# Patient Record
Sex: Female | Born: 1980
Health system: Southern US, Community
[De-identification: ages and names within clinical notes are randomized; demographics above are authoritative.]

## PROBLEM LIST (undated history)

## (undated) DIAGNOSIS — D171 Benign lipomatous neoplasm of skin and subcutaneous tissue of trunk: Secondary | ICD-10-CM

## (undated) DIAGNOSIS — K921 Melena: Secondary | ICD-10-CM

## (undated) DIAGNOSIS — U071 COVID-19: Secondary | ICD-10-CM

## (undated) DIAGNOSIS — H8120 Vestibular neuronitis, unspecified ear: Secondary | ICD-10-CM

## (undated) HISTORY — DX: Vestibular neuronitis, unspecified ear: H81.20

## (undated) HISTORY — DX: COVID-19: U07.1

## (undated) HISTORY — DX: Benign lipomatous neoplasm of skin and subcutaneous tissue of trunk: D17.1

## (undated) HISTORY — PX: COSMETIC SURGERY: SHX468

## (undated) HISTORY — PX: TONSILLECTOMY: SUR1361

## (undated) HISTORY — DX: Melena: K92.1

---
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
## 2004-01-17 ENCOUNTER — Other Ambulatory Visit: Admission: RE | Admit: 2004-01-17 | Discharge: 2004-01-17 | Payer: Self-pay | Admitting: Obstetrics and Gynecology

## 2004-05-22 ENCOUNTER — Emergency Department (HOSPITAL_COMMUNITY): Admission: EM | Admit: 2004-05-22 | Discharge: 2004-05-22 | Payer: Self-pay | Admitting: Emergency Medicine

## 2004-07-02 ENCOUNTER — Other Ambulatory Visit: Admission: RE | Admit: 2004-07-02 | Discharge: 2004-07-02 | Payer: Self-pay | Admitting: Obstetrics and Gynecology

## 2004-11-11 ENCOUNTER — Other Ambulatory Visit: Admission: RE | Admit: 2004-11-11 | Discharge: 2004-11-11 | Payer: Self-pay | Admitting: Obstetrics and Gynecology

## 2005-04-28 ENCOUNTER — Other Ambulatory Visit: Admission: RE | Admit: 2005-04-28 | Discharge: 2005-04-28 | Payer: Self-pay | Admitting: Obstetrics and Gynecology

## 2006-05-12 ENCOUNTER — Other Ambulatory Visit: Admission: RE | Admit: 2006-05-12 | Discharge: 2006-05-12 | Payer: Self-pay | Admitting: Obstetrics and Gynecology

## 2007-05-16 ENCOUNTER — Other Ambulatory Visit: Admission: RE | Admit: 2007-05-16 | Discharge: 2007-05-16 | Payer: Self-pay | Admitting: Obstetrics and Gynecology

## 2008-05-27 ENCOUNTER — Other Ambulatory Visit: Admission: RE | Admit: 2008-05-27 | Discharge: 2008-05-27 | Payer: Self-pay | Admitting: Obstetrics and Gynecology

## 2008-10-10 ENCOUNTER — Inpatient Hospital Stay (HOSPITAL_COMMUNITY): Admission: AD | Admit: 2008-10-10 | Discharge: 2008-10-10 | Payer: Self-pay | Admitting: Obstetrics and Gynecology

## 2009-01-18 ENCOUNTER — Inpatient Hospital Stay (HOSPITAL_COMMUNITY): Admission: AD | Admit: 2009-01-18 | Discharge: 2009-01-18 | Payer: Self-pay | Admitting: Obstetrics and Gynecology

## 2009-05-01 ENCOUNTER — Ambulatory Visit (HOSPITAL_COMMUNITY): Admission: RE | Admit: 2009-05-01 | Discharge: 2009-05-01 | Payer: Self-pay | Admitting: Obstetrics and Gynecology

## 2009-05-03 ENCOUNTER — Inpatient Hospital Stay (HOSPITAL_COMMUNITY): Admission: AD | Admit: 2009-05-03 | Discharge: 2009-05-06 | Payer: Self-pay | Admitting: Obstetrics and Gynecology

## 2010-06-09 ENCOUNTER — Other Ambulatory Visit: Admission: RE | Admit: 2010-06-09 | Discharge: 2010-06-09 | Payer: Self-pay | Admitting: Obstetrics and Gynecology

## 2010-10-22 LAB — RPR: RPR Ser Ql: NONREACTIVE

## 2010-10-22 LAB — CBC
HCT: 30.1 % — ABNORMAL LOW (ref 36.0–46.0)
HCT: 36.1 % (ref 36.0–46.0)
Hemoglobin: 10.1 g/dL — ABNORMAL LOW (ref 12.0–15.0)
Hemoglobin: 12.3 g/dL (ref 12.0–15.0)
MCHC: 33.6 g/dL (ref 30.0–36.0)
MCHC: 34.2 g/dL (ref 30.0–36.0)
MCV: 85.9 fL (ref 78.0–100.0)
MCV: 86.7 fL (ref 78.0–100.0)
Platelets: 130 10*3/uL — ABNORMAL LOW (ref 150–400)
Platelets: 182 10*3/uL (ref 150–400)
RBC: 3.47 MIL/uL — ABNORMAL LOW (ref 3.87–5.11)
RBC: 4.2 MIL/uL (ref 3.87–5.11)
RDW: 14.8 % (ref 11.5–15.5)
RDW: 14.9 % (ref 11.5–15.5)
WBC: 15.2 10*3/uL — ABNORMAL HIGH (ref 4.0–10.5)
WBC: 9.1 10*3/uL (ref 4.0–10.5)

## 2010-10-29 LAB — URINALYSIS, ROUTINE W REFLEX MICROSCOPIC
Bilirubin Urine: NEGATIVE
Glucose, UA: NEGATIVE mg/dL
Hgb urine dipstick: NEGATIVE
Ketones, ur: NEGATIVE mg/dL
Nitrite: NEGATIVE
Protein, ur: NEGATIVE mg/dL
Specific Gravity, Urine: 1.015 (ref 1.005–1.030)
Urobilinogen, UA: 0.2 mg/dL (ref 0.0–1.0)
pH: 6.5 (ref 5.0–8.0)

## 2010-10-29 LAB — GC/CHLAMYDIA PROBE AMP, GENITAL
Chlamydia, DNA Probe: NEGATIVE
GC Probe Amp, Genital: NEGATIVE

## 2010-10-29 LAB — WET PREP, GENITAL
Trich, Wet Prep: NONE SEEN
Yeast Wet Prep HPF POC: NONE SEEN

## 2011-05-31 ENCOUNTER — Other Ambulatory Visit (HOSPITAL_COMMUNITY)
Admission: RE | Admit: 2011-05-31 | Discharge: 2011-05-31 | Disposition: A | Discharge status: Home / Self Care | Payer: 59 | Source: Ambulatory Visit | Attending: Obstetrics and Gynecology | Admitting: Obstetrics and Gynecology

## 2011-05-31 ENCOUNTER — Other Ambulatory Visit: Payer: Self-pay | Admitting: Obstetrics and Gynecology

## 2011-05-31 DIAGNOSIS — Z113 Encounter for screening for infections with a predominantly sexual mode of transmission: Secondary | ICD-10-CM | POA: Insufficient documentation

## 2011-05-31 DIAGNOSIS — Z01419 Encounter for gynecological examination (general) (routine) without abnormal findings: Secondary | ICD-10-CM | POA: Insufficient documentation

## 2012-03-28 ENCOUNTER — Ambulatory Visit (INDEPENDENT_AMBULATORY_CARE_PROVIDER_SITE_OTHER): Payer: 59 | Admitting: Family Medicine

## 2012-03-28 VITALS — BP 128/74 | HR 68 | Temp 97.8°F | Resp 18 | Ht 59.0 in | Wt 122.0 lb

## 2012-03-28 DIAGNOSIS — H6980 Other specified disorders of Eustachian tube, unspecified ear: Secondary | ICD-10-CM

## 2012-03-28 DIAGNOSIS — H6990 Unspecified Eustachian tube disorder, unspecified ear: Secondary | ICD-10-CM

## 2012-03-28 DIAGNOSIS — R42 Dizziness and giddiness: Secondary | ICD-10-CM

## 2012-03-28 LAB — GLUCOSE, POCT (MANUAL RESULT ENTRY): POC Glucose: 96 mg/dl (ref 70–99)

## 2012-03-28 LAB — POCT CBC
Granulocyte percent: 68 %G (ref 37–80)
HCT, POC: 44.6 % (ref 37.7–47.9)
Hemoglobin: 14.2 g/dL (ref 12.2–16.2)
Lymph, poc: 2.3 (ref 0.6–3.4)
MCH, POC: 27.6 pg (ref 27–31.2)
MCHC: 31.8 g/dL (ref 31.8–35.4)
MCV: 86.6 fL (ref 80–97)
MID (cbc): 0.4 (ref 0–0.9)
MPV: 8.6 fL (ref 0–99.8)
POC Granulocyte: 5.8 (ref 2–6.9)
POC LYMPH PERCENT: 27.3 %L (ref 10–50)
POC MID %: 4.7 %M (ref 0–12)
Platelet Count, POC: 341 10*3/uL (ref 142–424)
RBC: 5.15 M/uL (ref 4.04–5.48)
RDW, POC: 14.1 %
WBC: 8.5 10*3/uL (ref 4.6–10.2)

## 2012-03-28 MED ORDER — MONTELUKAST SODIUM 10 MG PO TABS: 10.0000 mg | ORAL_TABLET | Freq: Every day | ORAL | Status: DC

## 2012-03-28 NOTE — Patient Instructions (Signed)
Results for orders placed in visit on 03/28/12  POCT CBC      Component Value Range   WBC 8.5  4.6 - 10.2 K/uL   Lymph, poc 2.3  0.6 - 3.4   POC LYMPH PERCENT 27.3  10 - 50 %L   MID (cbc) 0.4  0 - 0.9   POC MID % 4.7  0 - 12 %M   POC Granulocyte 5.8  2 - 6.9   Granulocyte percent 68.0  37 - 80 %G   RBC 5.15  4.04 - 5.48 M/uL   Hemoglobin 14.2  12.2 - 16.2 g/dL   HCT, POC 40.9  81.1 - 47.9 %   MCV 86.6  80 - 97 fL   MCH, POC 27.6  27 - 31.2 pg   MCHC 31.8  31.8 - 35.4 g/dL   RDW, POC 91.4     Platelet Count, POC 341  142 - 424 K/uL   MPV 8.6  0 - 99.8 fL  GLUCOSE, POCT (MANUAL RESULT ENTRY)      Component Value Range   POC Glucose 96  70 - 99 mg/dl

## 2012-03-28 NOTE — Progress Notes (Signed)
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 She's had no falls or syncope, she does not have heavy periods because of the Mirena, and she does not have a history of dizziness.. She denies ear pain or tinnitus.  Objective: No acute distress, cheerful Blood pressure sitting is 100/60 and standing is 90/60 HEENT unremarkable with exception of scars on her tympanic membranes and marked serous fluid with retraction of the TMs. Chest: Clear Neck: Supple  Results for orders placed in visit on 03/28/12  POCT CBC      Component Value Range   WBC 8.5  4.6 - 10.2 K/uL   Lymph, poc 2.3  0.6 - 3.4   POC LYMPH PERCENT 27.3  10 - 50 %L   MID (cbc) 0.4  0 - 0.9   POC MID % 4.7  0 - 12 %M   POC Granulocyte 5.8  2 - 6.9   Granulocyte percent 68.0  37 - 80 %G   RBC 5.15  4.04 - 5.48 M/uL   Hemoglobin 14.2  12.2 - 16.2 g/dL   HCT, POC 40.9  81.1 - 47.9 %   MCV 86.6  80 - 97 fL   MCH, POC 27.6  27 - 31.2 pg   MCHC 31.8  31.8 - 35.4 g/dL   RDW, POC 91.4     Platelet Count, POC 341  142 - 424 K/uL   MPV 8.6  0 - 99.8 fL  GLUCOSE, POCT (MANUAL RESULT ENTRY)      Component Value Range   POC Glucose 96  70 - 99 mg/dl   1. Dizziness  POCT CBC, POCT glucose (manual entry), montelukast (SINGULAIR) 10 MG tablet  2. ETD (eustachian tube dysfunction)  montelukast (SINGULAIR) 10 MG tablet   If not improving in 48-72 hours, will need to further investigate.  The cloudy fluid bilaterally behind TM's does suggest ETD as etiology at this point given other normal findings.

## 2012-03-29 ENCOUNTER — Emergency Department (HOSPITAL_COMMUNITY)
Admission: EM | Admit: 2012-03-29 | Discharge: 2012-03-29 | Disposition: A | Discharge status: Home / Self Care | Payer: 59 | Attending: Emergency Medicine | Admitting: Emergency Medicine

## 2012-03-29 DIAGNOSIS — H8309 Labyrinthitis, unspecified ear: Secondary | ICD-10-CM | POA: Insufficient documentation

## 2012-03-29 DIAGNOSIS — R42 Dizziness and giddiness: Secondary | ICD-10-CM

## 2012-03-29 LAB — URINALYSIS, ROUTINE W REFLEX MICROSCOPIC
Bilirubin Urine: NEGATIVE
Glucose, UA: NEGATIVE mg/dL
Ketones, ur: 15 mg/dL — AB
Nitrite: NEGATIVE
Protein, ur: NEGATIVE mg/dL
Specific Gravity, Urine: 1.03 (ref 1.005–1.030)
Urobilinogen, UA: 0.2 mg/dL (ref 0.0–1.0)
pH: 5.5 (ref 5.0–8.0)

## 2012-03-29 LAB — URINE MICROSCOPIC-ADD ON

## 2012-03-29 LAB — POCT PREGNANCY, URINE: Preg Test, Ur: NEGATIVE

## 2012-03-29 MED ORDER — MECLIZINE HCL 25 MG PO TABS
25.0000 mg | ORAL_TABLET | Freq: Once | ORAL | Status: AC
  Administered 2012-03-29: 25 mg via ORAL
  Filled 2012-03-29: qty 1

## 2012-03-29 MED ORDER — MECLIZINE HCL 50 MG PO TABS: 50.0000 mg | ORAL_TABLET | Freq: Three times a day (TID) | ORAL | Status: AC | PRN

## 2012-03-29 NOTE — ED Notes (Signed)
Pt states [facility] and today she started to have vomiting. States has had morena ring for bc x 2 years

## 2012-03-29 NOTE — ED Provider Notes (Signed)
History     CSN: [id number]  Arrival date & time 03/29/12  0845   First MD Initiated Contact with Patient 03/29/12 [phone]      Chief Complaint  Patient presents with  . Emesis    (Consider location/radiation/quality/duration/timing/severity/associated sxs/prior treatment) Patient is a 31 y.o. female presenting with vomiting. The history is provided by the patient.  Emesis  Pertinent negatives include no chills, no diarrhea, no fever and no headaches.   31 year old, female, presents emergency department complaining of dizziness, with nausea and vomiting.  She says she feels as if to order spinning around.  She denies pain anywhere.  She denies vision changes.  She states when she walks.  She has to hold onto the wall, so, she doesn't fall.  She denies weakness in her arms or legs.  Her symptoms have been present for the past 3 days.  She was seen at an urgent care center, where she was given an antihistamine.  That has not relieved her symptoms, so, she presented to the emergency department.  She has no past medical history.  She does not smoke or drink.  She denies recent URI symptoms.  She denies drainage from her ears or ear pain.  No past medical history on file.  No past surgical history on file.  No family history on file.  History  Substance Use Topics  . Smoking status: Never Smoker   . Smokeless tobacco: Not on file  . Alcohol Use: Not on file    OB History    Grav Para Term Preterm Abortions TAB SAB Ect Mult Living                  Review of Systems  Constitutional: Negative for fever and chills.  HENT: Negative for hearing loss, ear pain, congestion, sore throat, neck pain, sinus pressure, tinnitus and ear discharge.   Eyes: Negative for pain and visual disturbance.  Cardiovascular: Negative for chest pain and palpitations.  Gastrointestinal: Positive for nausea and vomiting. Negative for diarrhea.  Genitourinary: Negative for dysuria.  Skin: Negative for rash.    Neurological: Positive for dizziness. Negative for headaches.  Psychiatric/Behavioral: Negative for confusion.  All other systems reviewed and are negative.    Allergies  Review of patient's allergies indicates no known allergies.  Home Medications   Current Outpatient Rx  Name Route Sig Dispense Refill  . MONTELUKAST SODIUM 10 MG PO TABS Oral Take 1 tablet (10 mg total) by mouth at bedtime. 30 tablet 3  . ONE-DAILY MULTI VITAMINS PO TABS Oral Take 1 tablet by mouth daily.      BP 117/68  Pulse 72  Temp 98.7 F (37.1 C) (Oral)  Resp 18  SpO2 100%  Physical Exam  Nursing note and vitals reviewed. Constitutional: She is oriented to person, place, and time. She appears well-developed and well-nourished. No distress.  HENT:  Head: Normocephalic and atraumatic.  Right Ear: External ear normal.  Left Ear: External ear normal.  Mouth/Throat: Oropharynx is clear and moist. No oropharyngeal exudate.  Eyes: Conjunctivae normal and EOM are normal. Pupils are equal, round, and reactive to light.       No nystagmus  Neck: Normal range of motion. Neck supple.  Cardiovascular: Normal rate and intact distal pulses.   No murmur heard. Pulmonary/Chest: Effort normal and breath sounds normal. No respiratory distress.  Abdominal: Soft. She exhibits no distension. There is no tenderness.  Musculoskeletal: Normal range of motion. She exhibits no edema.  Neurological: She is  alert and oriented to person, place, and time. She has normal strength. She displays no tremor. No cranial nerve deficit. She displays a negative Romberg sign. Coordination and gait normal. GCS eye subscore is 4. GCS verbal subscore is 5. GCS motor subscore is 6.  Skin: Skin is warm and dry.  Psychiatric: She has a normal mood and affect. Thought content normal.    ED Course  Procedures (including critical care time) 31 year old, female, with symptoms consistent with peripheral vertigo.  She has a normal mental status.   Normal.  Neurological examination.  I do not believe that a CAT scan or MRI are indicated. Labs Reviewed  URINALYSIS, ROUTINE W REFLEX MICROSCOPIC - Abnormal; Notable for the following:    Color, Urine AMBER (*)  BIOCHEMICALS MAY BE AFFECTED BY COLOR   APPearance TURBID (*)     Hgb urine dipstick TRACE (*)     Ketones, ur 15 (*)     Leukocytes, UA SMALL (*)     All other components within normal limits  URINE MICROSCOPIC-ADD ON - Abnormal; Notable for the following:    Squamous Epithelial / LPF MANY (*)     Bacteria, UA MANY (*)     All other components within normal limits  POCT PREGNANCY, URINE   No results found.   No diagnosis found.    MDM  Labyrinthitis No evidence of abnormal neurological examination, or alteration in mental status.  No evidence of systemic illness.        Cheri Guppy, MD 03/29/12 1056

## 2012-03-31 ENCOUNTER — Ambulatory Visit (INDEPENDENT_AMBULATORY_CARE_PROVIDER_SITE_OTHER): Payer: 59 | Admitting: Family Medicine

## 2012-03-31 VITALS — BP 110/60 | HR 78 | Temp 98.0°F | Resp 16 | Ht 59.0 in | Wt 120.8 lb

## 2012-03-31 DIAGNOSIS — R42 Dizziness and giddiness: Secondary | ICD-10-CM

## 2012-03-31 DIAGNOSIS — H6980 Other specified disorders of Eustachian tube, unspecified ear: Secondary | ICD-10-CM

## 2012-03-31 NOTE — Progress Notes (Signed)
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 The day after I saw her several days ago she had to go to the emergency room where she was prescribed meclizine. Neither the Singulair nor the meclizine has helped her in any way.  Past medical history is significant for ventilation tube placement.  Objective: Ears continue be retracted with no Q wave fluid and old ventilation tube scars. She's in no acute distress Fundi: Normal Nose: Pale the turbinates with mild swelling Oropharynx: Clear Gait: Stable Patient alert and cooperative  Assessment: Eustachian tube dysfunction with mild dizziness   1. ETD (eustachian tube dysfunction)  Ambulatory referral to ENT  2. Vertigo  Ambulatory referral to ENT

## 2012-05-30 ENCOUNTER — Other Ambulatory Visit: Payer: Self-pay | Admitting: Obstetrics and Gynecology

## 2012-05-30 ENCOUNTER — Other Ambulatory Visit (HOSPITAL_COMMUNITY)
Admission: RE | Admit: 2012-05-30 | Discharge: 2012-05-30 | Disposition: A | Discharge status: Home / Self Care | Payer: 59 | Source: Ambulatory Visit | Attending: Obstetrics and Gynecology | Admitting: Obstetrics and Gynecology

## 2012-05-30 DIAGNOSIS — Z01419 Encounter for gynecological examination (general) (routine) without abnormal findings: Secondary | ICD-10-CM | POA: Insufficient documentation

## 2012-05-30 DIAGNOSIS — Z113 Encounter for screening for infections with a predominantly sexual mode of transmission: Secondary | ICD-10-CM | POA: Insufficient documentation

## 2013-06-22 ENCOUNTER — Other Ambulatory Visit (HOSPITAL_COMMUNITY)
Admission: RE | Admit: 2013-06-22 | Discharge: 2013-06-22 | Disposition: A | Discharge status: Home / Self Care | Payer: 59 | Source: Ambulatory Visit | Attending: Obstetrics and Gynecology | Admitting: Obstetrics and Gynecology

## 2013-06-22 ENCOUNTER — Other Ambulatory Visit: Payer: Self-pay | Admitting: Obstetrics and Gynecology

## 2013-06-22 DIAGNOSIS — Z1151 Encounter for screening for human papillomavirus (HPV): Secondary | ICD-10-CM | POA: Insufficient documentation

## 2013-06-22 DIAGNOSIS — R87810 Cervical high risk human papillomavirus (HPV) DNA test positive: Secondary | ICD-10-CM | POA: Insufficient documentation

## 2013-06-22 DIAGNOSIS — Z01419 Encounter for gynecological examination (general) (routine) without abnormal findings: Secondary | ICD-10-CM | POA: Insufficient documentation

## 2013-06-22 DIAGNOSIS — Z113 Encounter for screening for infections with a predominantly sexual mode of transmission: Secondary | ICD-10-CM | POA: Insufficient documentation

## 2013-12-01 ENCOUNTER — Emergency Department (HOSPITAL_COMMUNITY)
Admission: EM | Admit: 2013-12-01 | Discharge: 2013-12-01 | Disposition: A | Discharge status: Home / Self Care | Payer: 59 | Source: Home / Self Care | Attending: Emergency Medicine | Admitting: Emergency Medicine

## 2013-12-01 ENCOUNTER — Encounter (HOSPITAL_COMMUNITY): Payer: Self-pay | Admitting: Emergency Medicine

## 2013-12-01 DIAGNOSIS — N3000 Acute cystitis without hematuria: Secondary | ICD-10-CM

## 2013-12-01 LAB — POCT URINALYSIS DIP (DEVICE)
Bilirubin Urine: NEGATIVE
Glucose, UA: NEGATIVE mg/dL
Ketones, ur: NEGATIVE mg/dL
Nitrite: NEGATIVE
Protein, ur: NEGATIVE mg/dL
Specific Gravity, Urine: 1.02 (ref 1.005–1.030)
Urobilinogen, UA: 0.2 mg/dL (ref 0.0–1.0)
pH: 6 (ref 5.0–8.0)

## 2013-12-01 MED ORDER — CEPHALEXIN 500 MG PO CAPS: 500.0000 mg | ORAL_CAPSULE | Freq: Three times a day (TID) | ORAL | Status: DC

## 2013-12-01 MED ORDER — PHENAZOPYRIDINE HCL 200 MG PO TABS: 200.0000 mg | ORAL_TABLET | Freq: Three times a day (TID) | ORAL | Status: DC | PRN

## 2013-12-01 MED ORDER — FLUCONAZOLE 150 MG PO TABS: 150.0000 mg | ORAL_TABLET | Freq: Once | ORAL | Status: DC

## 2013-12-01 NOTE — Discharge Instructions (Signed)
Urinary Tract Infection  Urinary tract infections (UTIs) can develop anywhere along your urinary tract. Your urinary tract is your body's drainage system for removing wastes and extra water. Your urinary tract includes two kidneys, two ureters, a bladder, and a urethra. Your kidneys are a pair of bean-shaped organs. Each kidney is about the size of your fist. They are located below your ribs, one on each side of your spine.  CAUSES  Infections are caused by microbes, which are microscopic organisms, including fungi, viruses, and bacteria. These organisms are so small that they can only be seen through a microscope. Bacteria are the microbes that most commonly cause UTIs.  SYMPTOMS   Symptoms of UTIs may vary by age and gender of the patient and by the location of the infection. Symptoms in young women typically include a frequent and intense urge to urinate and a painful, burning feeling in the bladder or urethra during urination. Older women and men are more likely to be tired, shaky, and weak and have muscle aches and abdominal pain. A fever may mean the infection is in your kidneys. Other symptoms of a kidney infection include pain in your back or sides below the ribs, nausea, and vomiting.  DIAGNOSIS  To diagnose a UTI, your caregiver will ask you about your symptoms. Your caregiver also will ask to provide a urine sample. The urine sample will be tested for bacteria and white blood cells. White blood cells are made by your body to help fight infection.  TREATMENT   Typically, UTIs can be treated with medication. Because most UTIs are caused by a bacterial infection, they usually can be treated with the use of antibiotics. The choice of antibiotic and length of treatment depend on your symptoms and the type of bacteria causing your infection.  HOME CARE INSTRUCTIONS   If you were prescribed antibiotics, take them exactly as your caregiver instructs you. Finish the medication even if you feel better after you  have only taken some of the medication.   Drink enough water and fluids to keep your urine clear or pale yellow.   Avoid caffeine, tea, and carbonated beverages. They tend to irritate your bladder.   Empty your bladder often. Avoid holding urine for long periods of time.   Empty your bladder before and after sexual intercourse.   After a bowel movement, women should cleanse from front to back. Use each tissue only once.  SEEK MEDICAL CARE IF:    You have back pain.   You develop a fever.   Your symptoms do not begin to resolve within 3 days.  SEEK IMMEDIATE MEDICAL CARE IF:    You have severe back pain or lower abdominal pain.   You develop chills.   You have nausea or vomiting.   You have continued burning or discomfort with urination.  MAKE SURE YOU:    Understand these instructions.   Will watch your condition.   Will get help right away if you are not doing well or get worse.  Document Released: 04/14/2005 Document Revised: 01/04/2012 Document Reviewed: 08/13/2011  ExitCare Patient Information 2014 [employer].

## 2013-12-01 NOTE — ED Provider Notes (Signed)
Chief Complaint   Chief Complaint  Patient presents with  . Urinary Tract Infection    History of Present Illness   Annette Gutierrez is a 33 year old female who has had a four-day history of urinary frequency, urgency, dysuria, hematuria. She's also had some lower back pain and chills. She denies any fever, nausea, vomiting, abdominal pain, or GYN complaints. She's had 2 urinary tract infections in the past, one in her teens, and 1 when she was pregnant.  Review of Systems   Other than as noted above, the patient denies any of the following symptoms: General:  No fevers or chills. GI:  No abdominal pain, back pain, nausea, or vomiting. GU:  No hematuria or incontinence. GYN:  No discharge, itching, vulvar pain or lesions, pelvic pain, or abnormal vaginal bleeding.  [place]   Past medical history, family history, social history, meds, and allergies were reviewed.  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  Physical Examination     Vital signs:  BP 120/75  Pulse 75  Temp(Src) 98.4 F (36.9 C) (Oral)  Resp 17  SpO2 100% Gen:  Alert, oriented, in no distress. Lungs:  Clear to auscultation, no wheezes, rales or rhonchi. Heart:  Regular rhythm, no gallop or murmer. Abdomen:  Flat and soft. There was slight suprapubic pain to palpation.  No guarding, or rebound.  No hepato-splenomegaly or mass.  Bowel sounds were normally active.  No hernia. Back:  No CVA tenderness.  Skin:  Clear, warm and dry.  Labs   Results for orders placed during the hospital encounter of 12/01/13  POCT URINALYSIS DIP (DEVICE)      Result Value Ref Range   Glucose, UA NEGATIVE  NEGATIVE mg/dL   Bilirubin Urine NEGATIVE  NEGATIVE   Ketones, ur NEGATIVE  NEGATIVE mg/dL   Specific Gravity, Urine 1.020  1.005 - 1.030   Hgb urine dipstick TRACE (*) NEGATIVE   pH 6.0  5.0 - 8.0   Protein, ur NEGATIVE  NEGATIVE mg/dL   Urobilinogen, UA 0.2  0.0 - 1.0 mg/dL   Nitrite NEGATIVE  NEGATIVE   Leukocytes, UA SMALL (*)  NEGATIVE     A urine culture was obtained.  Results are pending at this time and we will call about any positive results.  Assessment   The encounter diagnosis was Acute cystitis.   No evidence of pyelonephritis.    Plan   1.  Meds:  The following meds were prescribed:   Discharge Medication List as of 12/01/2013  9:57 AM    START taking these medications   Details  cephALEXin (KEFLEX) 500 MG capsule Take 1 capsule (500 mg total) by mouth 3 (three) times daily., Starting 12/01/2013, Until Discontinued, Normal    fluconazole (DIFLUCAN) 150 MG tablet Take 1 tablet (150 mg total) by mouth once., Starting 12/01/2013, Normal    phenazopyridine (PYRIDIUM) 200 MG tablet Take 1 tablet (200 mg total) by mouth 3 (three) times daily as needed for pain., Starting 12/01/2013, Until Discontinued, Normal        2.  Patient Education/Counseling:  The patient was given appropriate handouts, self care instructions, and instructed in symptomatic relief. The patient was told to avoid intercourse for 10 days, get extra fluids, and return for a follow up with her primary care doctor at the completion of treatment for a repeat UA and culture.    3.  Follow up:  The patient was told to follow up here if no better in 3 to 4 days,  or sooner if becoming worse in any way, and given some red flag symptoms such as fever, persistent vomiting, or severe flank or abdominal pain which would prompt immediate return.     Harden Mo, MD 12/01/13 1022

## 2013-12-01 NOTE — ED Notes (Signed)
3 days of urinary frequency and dysuria.  She denies fever, but does have dull bilateral flank pain

## 2013-12-03 ENCOUNTER — Telehealth (HOSPITAL_COMMUNITY): Payer: Self-pay | Admitting: Emergency Medicine

## 2013-12-03 LAB — URINE CULTURE: Colony Count: >=100000

## 2013-12-03 MED ORDER — CIPROFLOXACIN HCL 500 MG PO TABS: 500.0000 mg | ORAL_TABLET | Freq: Two times a day (BID) | ORAL | Status: DC

## 2013-12-03 NOTE — ED Notes (Signed)
Her urine culture showed Escherichia coli which was resistant to cephalexin. It is sensitive to ciprofloxacin. So will call in a prescription for Cipro 500 mg, #20, 1 twice a day. She can stop the cephalexin. Finish up the Cipro. Prescriptions be called into the [facility] on [address].  Harden Mo, MD 12/03/13 [phone]

## 2013-12-04 ENCOUNTER — Telehealth (HOSPITAL_COMMUNITY): Payer: Self-pay | Admitting: *Deleted

## 2013-12-04 NOTE — ED Notes (Signed)
Pt. called back.  Pt. verified x 2 and given results.  Pt. told the Keflex is resistant to the bacteria and she needs to stop it and take all of the Cipro. Pt. told where to pick up her Rx.  Pt. voiced understanding. Hanley Seamen Shacara Cozine 12/04/2013

## 2013-12-04 NOTE — ED Notes (Signed)
I called and left a message to call.  Call 1. Hanley Seamen Lonetta Blassingame 12/04/2013

## 2014-02-25 ENCOUNTER — Other Ambulatory Visit: Payer: Self-pay | Admitting: Internal Medicine

## 2014-02-26 LAB — HEPATITIS B SURFACE ANTIBODY,QUALITATIVE: Hep B S Ab: POSITIVE — AB

## 2014-03-01 ENCOUNTER — Encounter: Payer: Self-pay | Admitting: Radiology

## 2014-06-21 ENCOUNTER — Other Ambulatory Visit (HOSPITAL_COMMUNITY)
Admission: RE | Admit: 2014-06-21 | Discharge: 2014-06-21 | Disposition: A | Discharge status: Home / Self Care | Payer: BC Managed Care – PPO | Source: Ambulatory Visit | Attending: Obstetrics and Gynecology | Admitting: Obstetrics and Gynecology

## 2014-06-21 ENCOUNTER — Other Ambulatory Visit: Payer: Self-pay | Admitting: Obstetrics and Gynecology

## 2014-06-21 DIAGNOSIS — Z113 Encounter for screening for infections with a predominantly sexual mode of transmission: Secondary | ICD-10-CM | POA: Diagnosis present

## 2014-06-21 DIAGNOSIS — Z01419 Encounter for gynecological examination (general) (routine) without abnormal findings: Secondary | ICD-10-CM | POA: Insufficient documentation

## 2014-06-24 LAB — CYTOLOGY - PAP

## 2015-07-07 ENCOUNTER — Other Ambulatory Visit (HOSPITAL_COMMUNITY)
Admission: RE | Admit: 2015-07-07 | Discharge: 2015-07-07 | Disposition: A | Discharge status: Home / Self Care | Payer: BLUE CROSS/BLUE SHIELD | Source: Ambulatory Visit | Attending: Obstetrics and Gynecology | Admitting: Obstetrics and Gynecology

## 2015-07-07 ENCOUNTER — Other Ambulatory Visit: Payer: Self-pay | Admitting: Obstetrics and Gynecology

## 2015-07-07 DIAGNOSIS — Z113 Encounter for screening for infections with a predominantly sexual mode of transmission: Secondary | ICD-10-CM | POA: Diagnosis present

## 2015-07-07 DIAGNOSIS — Z01411 Encounter for gynecological examination (general) (routine) with abnormal findings: Secondary | ICD-10-CM | POA: Insufficient documentation

## 2015-07-08 LAB — CYTOLOGY - PAP

## 2015-11-17 LAB — HM COLONOSCOPY

## 2016-07-07 ENCOUNTER — Other Ambulatory Visit (HOSPITAL_COMMUNITY)
Admission: RE | Admit: 2016-07-07 | Discharge: 2016-07-07 | Disposition: A | Discharge status: Home / Self Care | Payer: BLUE CROSS/BLUE SHIELD | Source: Ambulatory Visit | Attending: Obstetrics and Gynecology | Admitting: Obstetrics and Gynecology

## 2016-07-07 ENCOUNTER — Other Ambulatory Visit: Payer: Self-pay | Admitting: Obstetrics and Gynecology

## 2016-07-07 DIAGNOSIS — Z01419 Encounter for gynecological examination (general) (routine) without abnormal findings: Secondary | ICD-10-CM | POA: Diagnosis present

## 2016-07-07 DIAGNOSIS — Z1151 Encounter for screening for human papillomavirus (HPV): Secondary | ICD-10-CM | POA: Insufficient documentation

## 2016-07-07 DIAGNOSIS — Z113 Encounter for screening for infections with a predominantly sexual mode of transmission: Secondary | ICD-10-CM | POA: Diagnosis present

## 2016-07-07 LAB — HM PAP SMEAR: HM Pap smear: NORMAL

## 2016-07-09 LAB — CYTOLOGY - PAP
CHLAMYDIA, DNA PROBE: NEGATIVE
DIAGNOSIS: NEGATIVE
HPV: NOT DETECTED
Neisseria Gonorrhea: NEGATIVE

## 2018-09-01 ENCOUNTER — Encounter: Payer: Self-pay | Admitting: Internal Medicine

## 2018-09-01 ENCOUNTER — Encounter (INDEPENDENT_AMBULATORY_CARE_PROVIDER_SITE_OTHER): Payer: Self-pay

## 2018-09-01 ENCOUNTER — Ambulatory Visit: Payer: 59 | Admitting: Internal Medicine

## 2018-09-01 VITALS — BP 128/73 | HR 66 | Temp 98.5°F | Ht 59.0 in | Wt 125.6 lb

## 2018-09-01 DIAGNOSIS — Z13220 Encounter for screening for lipoid disorders: Secondary | ICD-10-CM | POA: Diagnosis not present

## 2018-09-01 DIAGNOSIS — D171 Benign lipomatous neoplasm of skin and subcutaneous tissue of trunk: Secondary | ICD-10-CM | POA: Diagnosis not present

## 2018-09-01 DIAGNOSIS — R739 Hyperglycemia, unspecified: Secondary | ICD-10-CM | POA: Diagnosis not present

## 2018-09-01 DIAGNOSIS — Z1389 Encounter for screening for other disorder: Secondary | ICD-10-CM | POA: Diagnosis not present

## 2018-09-01 DIAGNOSIS — Z0184 Encounter for antibody response examination: Secondary | ICD-10-CM | POA: Diagnosis not present

## 2018-09-01 DIAGNOSIS — E611 Iron deficiency: Secondary | ICD-10-CM

## 2018-09-01 DIAGNOSIS — Z0000 Encounter for general adult medical examination without abnormal findings: Secondary | ICD-10-CM

## 2018-09-01 DIAGNOSIS — Z1329 Encounter for screening for other suspected endocrine disorder: Secondary | ICD-10-CM | POA: Diagnosis not present

## 2018-09-01 DIAGNOSIS — E559 Vitamin D deficiency, unspecified: Secondary | ICD-10-CM | POA: Diagnosis not present

## 2018-09-01 DIAGNOSIS — Z1159 Encounter for screening for other viral diseases: Secondary | ICD-10-CM | POA: Diagnosis not present

## 2018-09-01 NOTE — Progress Notes (Signed)
Chief Complaint  Patient presents with  . Establish Care   New patient  1. C/o lipoma to left upper back present x 2 years painful now x 3 months and increasing in size pt wants removed  2. FH diabetes in mom, and 2 paternal uncles pt is c/w risk of diabetes   No other complaints    Review of Systems  Constitutional: Negative for weight loss.  HENT: Negative for hearing loss.   Eyes: Negative for blurred vision.  Respiratory: Negative for shortness of breath.   Cardiovascular: Negative for chest pain.  Gastrointestinal: Negative for abdominal pain.  Musculoskeletal: Negative for falls.  Skin: Negative for rash.       Lipoma left upper back    Neurological: Negative for headaches.  Psychiatric/Behavioral: Negative for depression.   Past Medical History:  Diagnosis Date  . Blood in stool   . Lipoma of back   . Vestibular neuritis    2010   Past Surgical History:  Procedure Laterality Date  . TONSILLECTOMY     38 y.o    Family History  Problem Relation Age of Onset  . Diabetes Mother   . Hyperlipidemia Mother   . Hypertension Mother   . Hyperlipidemia Father   . Diabetes Maternal Uncle   . Hyperlipidemia Maternal Grandmother   . Cancer Maternal Grandfather        lung smoker  . Cancer Paternal Grandfather        colon  . Diabetes Maternal Uncle    Social History   Socioeconomic History  . Marital status: Married    Spouse name: Not on file  . Number of children: Not on file  . Years of education: Not on file  . Highest education level: Not on file  Occupational History  . Not on file  Social Needs  . Financial resource strain: Not on file  . Food insecurity:    Worry: Not on file    Inability: Not on file  . Transportation needs:    Medical: Not on file    Non-medical: Not on file  Tobacco Use  . Smoking status: Never Smoker  . Smokeless tobacco: Never Used  Substance and Sexual Activity  . Alcohol use: Yes    Comment: occasionally  . Drug use:  No  . Sexual activity: Yes    Birth control/protection: I.U.D.  Lifestyle  . Physical activity:    Days per week: Not on file    Minutes per session: Not on file  . Stress: Not on file  Relationships  . Social connections:    Talks on phone: Not on file    Gets together: Not on file    Attends religious service: Not on file    Active member of club or organization: Not on file    Attends meetings of clubs or organizations: Not on file    Relationship status: Not on file  . Intimate partner violence:    Fear of current or ex partner: Not on file    Emotionally abused: Not on file    Physically abused: Not on file    Forced sexual activity: Not on file  Other Topics Concern  . 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 Biotin 5000 MCG TABS Take by mouth daily.  Marland Kitchen  Calcium-Phosphorus-Vitamin D (CITRACAL +D3 PO) Take by mouth daily.  . Multiple Vitamin (MULTIVITAMIN) tablet Take 1 tablet by mouth daily.   No Known Allergies No results found for this or any previous visit (from the past 2160 hour(s)). Objective  Body mass index is 25.37 kg/m. Wt Readings from Last 3 Encounters:  09/01/18 125 lb 9.6 oz (57 kg)  03/31/12 120 lb 12.8 oz (54.8 kg)  03/28/12 122 lb (55.3 kg)   Temp Readings from Last 3 Encounters:  09/01/18 98.5 F (36.9 C) (Oral)  12/01/13 98.4 F (36.9 C) (Oral)  03/31/12 98 F (36.7 C) (Oral)   BP Readings from Last 3 Encounters:  09/01/18 128/73  12/01/13 120/75  03/31/12 110/60   Pulse Readings from Last 3 Encounters:  09/01/18 66  12/01/13 75  03/31/12 78    Physical Exam Vitals signs and nursing note reviewed.  Constitutional:      Appearance: Normal appearance. She is well-developed and well-groomed.  HENT:     Head: Normocephalic and atraumatic.     Nose: Nose normal.     Mouth/Throat:      Mouth: Mucous membranes are moist.     Pharynx: Oropharynx is clear.  Eyes:     Conjunctiva/sclera: Conjunctivae normal.     Pupils: Pupils are equal, round, and reactive to light.  Cardiovascular:     Rate and Rhythm: Normal rate and regular rhythm.     Heart sounds: Normal heart sounds. No murmur.  Pulmonary:     Effort: Pulmonary effort is normal.     Breath sounds: Normal breath sounds.  Skin:    General: Skin is warm and dry.  Neurological:     General: No focal deficit present.     Mental Status: She is alert and oriented to person, place, and time. Mental status is at baseline.     Gait: Gait normal.  Psychiatric:        Attention and Perception: Attention and perception normal.        Mood and Affect: Mood and affect normal.        Speech: Speech normal.        Behavior: Behavior normal. Behavior is cooperative.        Thought Content: Thought content normal.        Cognition and Memory: Cognition and memory normal.        Judgment: Judgment normal.     Assessment   1. ~5.5 cm growing painful lipoma left upper back  2. HM Plan  1. Refer to Dr. Theodoro Kos [place] for consultation for removal  2.  Flu shot had 03/2018 at work  Tdap 11/13/16  Pap had 07/07/16 get records h/o abnormal pap 2006 s/p colp with nl paps since  Colonoscopy 2018-ask at f/u and get if had done  Per pt hep C testing neg 10/2017   Dr. Sheran Fava dental  Previous PCP Abundio Miu NP [facility] seen 03/2018  Ob/gyn Sadie Haber Christophe Louis get pap records had 07/07/16   Provider: Dr. Olivia Mackie [facility]

## 2018-09-01 NOTE — Patient Instructions (Addendum)
Dr. Theodoro Kos [facility] surgery  [phone] [address]    Lipoma  A lipoma is a noncancerous (benign) tumor that is made up of fat cells. This is a very common type of soft-tissue growth. Lipomas are usually found under the skin (subcutaneous). They may occur in any tissue of the body that contains fat. Common areas for lipomas to appear include the back, shoulders, buttocks, and thighs.  Lipomas grow slowly, and they are usually painless. Most lipomas do not cause problems and do not require treatment. What are the causes? The cause of this condition is not known. What increases the risk? You are more likely to develop this condition if:  You are 28-37 years old.  You have a family history of lipomas. What are the signs or symptoms? A lipoma usually appears as a small, round bump under the skin. In most cases, the lump will:  Feel soft or rubbery.  Not cause pain or other symptoms. However, if a lipoma is located in an area where it pushes on nerves, it can become painful or cause other symptoms. How is this diagnosed? A lipoma can usually be diagnosed with a physical exam. You may also have tests to confirm the diagnosis and to rule out other conditions. Tests may include:  Imaging tests, such as a CT scan or MRI.  Removal of a tissue sample to be looked at under a microscope (biopsy). How is this treated? Treatment for this condition depends on the size of the lipoma and whether it is causing any symptoms.  For small lipomas that are not causing problems, no treatment is needed.  If a lipoma is bigger or it causes problems, surgery may be done to remove the lipoma. Lipomas can also be removed to improve appearance. Most often, the procedure is done after applying a medicine that numbs the area (local anesthetic). Follow these instructions at home:  Watch your lipoma for any changes.  Keep all follow-up visits as told by your health care  provider. This is important. Contact a health care provider if:  Your lipoma becomes larger or hard.  Your lipoma becomes painful, red, or increasingly swollen. These could be signs of infection or a more serious condition. Get help right away if:  You develop tingling or numbness in an area near the lipoma. This could indicate that the lipoma is causing nerve damage. Summary  A lipoma is a noncancerous tumor that is made up of fat cells.  Most lipomas do not cause problems and do not require treatment.  If a lipoma is bigger or it causes problems, surgery may be done to remove the lipoma. This information is not intended to replace advice given to you by your health care provider. Make sure you discuss any questions you have with your health care provider. Document Released: 06/25/2002 Document Revised: 06/21/2017 Document Reviewed: 06/21/2017 Elsevier Interactive Patient Education  2019 [employer].

## 2018-09-01 NOTE — Progress Notes (Signed)
Pre visit review using our clinic review tool, if applicable. No additional management support is needed unless otherwise documented below in the visit note. 

## 2018-09-05 ENCOUNTER — Other Ambulatory Visit (INDEPENDENT_AMBULATORY_CARE_PROVIDER_SITE_OTHER): Payer: 59

## 2018-09-05 DIAGNOSIS — Z1389 Encounter for screening for other disorder: Secondary | ICD-10-CM

## 2018-09-05 DIAGNOSIS — R739 Hyperglycemia, unspecified: Secondary | ICD-10-CM | POA: Diagnosis not present

## 2018-09-05 DIAGNOSIS — Z1159 Encounter for screening for other viral diseases: Secondary | ICD-10-CM | POA: Diagnosis not present

## 2018-09-05 DIAGNOSIS — Z0000 Encounter for general adult medical examination without abnormal findings: Secondary | ICD-10-CM | POA: Diagnosis not present

## 2018-09-05 DIAGNOSIS — E559 Vitamin D deficiency, unspecified: Secondary | ICD-10-CM | POA: Diagnosis not present

## 2018-09-05 DIAGNOSIS — Z0184 Encounter for antibody response examination: Secondary | ICD-10-CM | POA: Diagnosis not present

## 2018-09-05 DIAGNOSIS — E611 Iron deficiency: Secondary | ICD-10-CM | POA: Diagnosis not present

## 2018-09-05 DIAGNOSIS — Z13220 Encounter for screening for lipoid disorders: Secondary | ICD-10-CM | POA: Diagnosis not present

## 2018-09-05 DIAGNOSIS — Z1329 Encounter for screening for other suspected endocrine disorder: Secondary | ICD-10-CM | POA: Diagnosis not present

## 2018-09-05 LAB — COMPREHENSIVE METABOLIC PANEL
ALT: 11 U/L (ref 0–35)
AST: 20 U/L (ref 0–37)
Albumin: 4.5 g/dL (ref 3.5–5.2)
Alkaline Phosphatase: 50 U/L (ref 39–117)
BUN: 13 mg/dL (ref 6–23)
CHLORIDE: 104 meq/L (ref 96–112)
CO2: 26 mEq/L (ref 19–32)
Calcium: 9.8 mg/dL (ref 8.4–10.5)
Creatinine, Ser: 0.96 mg/dL (ref 0.40–1.20)
GFR: 79.77 mL/min (ref >60.00)
Glucose, Bld: 81 mg/dL (ref 70–99)
Potassium: 4.7 mEq/L (ref 3.5–5.1)
Sodium: 138 mEq/L (ref 135–145)
Total Bilirubin: 0.7 mg/dL (ref 0.2–1.2)
Total Protein: 7.5 g/dL (ref 6.0–8.3)

## 2018-09-05 LAB — CBC WITH DIFFERENTIAL/PLATELET
BASOS PCT: 1 % (ref 0.0–3.0)
Basophils Absolute: 0.1 10*3/uL (ref 0.0–0.1)
Eosinophils Absolute: 0.1 10*3/uL (ref 0.0–0.7)
Eosinophils Relative: 1.5 % (ref 0.0–5.0)
HCT: 42.8 % (ref 36.0–46.0)
Hemoglobin: 14.3 g/dL (ref 12.0–15.0)
Lymphocytes Relative: 38 % (ref 12.0–46.0)
Lymphs Abs: 1.9 10*3/uL (ref 0.7–4.0)
MCHC: 33.4 g/dL (ref 30.0–36.0)
MCV: 86.1 fl (ref 78.0–100.0)
Monocytes Absolute: 0.4 10*3/uL (ref 0.1–1.0)
Monocytes Relative: 8.1 % (ref 3.0–12.0)
NEUTROS PCT: 51.4 % (ref 43.0–77.0)
Neutro Abs: 2.6 10*3/uL (ref 1.4–7.7)
Platelets: 296 10*3/uL (ref 150.0–400.0)
RBC: 4.97 Mil/uL (ref 3.87–5.11)
RDW: 13.5 % (ref 11.5–15.5)
WBC: 5.1 10*3/uL (ref 4.0–10.5)

## 2018-09-05 LAB — LIPID PANEL
Cholesterol: 203 mg/dL — ABNORMAL HIGH (ref 0–200)
HDL: 59.2 mg/dL (ref >39.00)
LDL Cholesterol: 131 mg/dL — ABNORMAL HIGH (ref 0–99)
NonHDL: 143.46
Total CHOL/HDL Ratio: 3
Triglycerides: 62 mg/dL (ref 0.0–149.0)
VLDL: 12.4 mg/dL (ref 0.0–40.0)

## 2018-09-05 LAB — T4, FREE: Free T4: 0.78 ng/dL (ref 0.60–1.60)

## 2018-09-05 LAB — HEMOGLOBIN A1C: Hgb A1c MFr Bld: 5.4 % (ref 4.6–6.5)

## 2018-09-05 LAB — TSH: TSH: 2.08 u[IU]/mL (ref 0.35–4.50)

## 2018-09-05 LAB — VITAMIN D 25 HYDROXY (VIT D DEFICIENCY, FRACTURES): VITD: 33.87 ng/mL (ref 30.00–100.00)

## 2018-09-06 ENCOUNTER — Encounter: Payer: Self-pay | Admitting: *Deleted

## 2018-09-06 LAB — URINALYSIS, ROUTINE W REFLEX MICROSCOPIC
Bilirubin Urine: NEGATIVE
Glucose, UA: NEGATIVE
Hgb urine dipstick: NEGATIVE
KETONES UR: NEGATIVE
Leukocytes,Ua: NEGATIVE
Nitrite: NEGATIVE
Protein, ur: NEGATIVE
Specific Gravity, Urine: 1.012 (ref 1.001–1.03)
pH: 5.5 (ref 5.0–8.0)

## 2018-09-06 LAB — HEPATITIS B SURFACE ANTIBODY, QUANTITATIVE: Hep B S AB Quant (Post): >1000 m[IU]/mL (ref >=10)

## 2018-09-06 LAB — MEASLES/MUMPS/RUBELLA IMMUNITY
Mumps IgG: 169 AU/mL
RUBEOLA IGG: 23.4 [AU]/ml
Rubella: 5.22 index

## 2018-09-06 LAB — IRON,TIBC AND FERRITIN PANEL
%SAT: 32 % (calc) (ref 16–45)
Ferritin: 62 ng/mL (ref 16–154)
IRON: 101 ug/dL (ref 40–190)
TIBC: 319 mcg/dL (calc) (ref 250–450)

## 2018-09-20 ENCOUNTER — Encounter: Payer: Self-pay | Admitting: Adult Health

## 2018-09-20 ENCOUNTER — Ambulatory Visit: Payer: 59 | Admitting: Adult Health

## 2018-09-20 VITALS — BP 110/80 | Temp 98.8°F | Wt 128.0 lb

## 2018-09-20 DIAGNOSIS — J329 Chronic sinusitis, unspecified: Secondary | ICD-10-CM | POA: Diagnosis not present

## 2018-09-20 DIAGNOSIS — B9789 Other viral agents as the cause of diseases classified elsewhere: Secondary | ICD-10-CM | POA: Diagnosis not present

## 2018-09-20 DIAGNOSIS — R059 Cough, unspecified: Secondary | ICD-10-CM

## 2018-09-20 DIAGNOSIS — R05 Cough: Secondary | ICD-10-CM

## 2018-09-20 LAB — POCT INFLUENZA A/B
Influenza A, POC: NEGATIVE
Influenza B, POC: NEGATIVE

## 2018-09-20 MED ORDER — HYDROCODONE-HOMATROPINE 5-1.5 MG/5ML PO SYRP: 5.0000 mL | ORAL_SOLUTION | Freq: Three times a day (TID) | ORAL | 0 refills | Status: DC | PRN

## 2018-09-20 NOTE — Progress Notes (Signed)
Subjective:    Patient ID: Annette Gutierrez, female    DOB: August 30, 1980, 38 y.o.   MRN: [id number]  HPI  38 year old female who  has a past medical history of Blood in stool, Lipoma of back, and Vestibular neuritis. She presents to the office today for the concern of possible flu. Her symptoms include that of less than 24 hours of non productive cough, sinus pain, pressure, headaches, mild body aches and chills.   She denies fevers, shortness of breath, wheezing, or n/v/d   She has not used anything OTC  Cough was bad enough to keep her from sleeping   Review of Systems See HPI   Past Medical History:  Diagnosis Date  . Blood in stool   . Lipoma of back   . Vestibular neuritis    2010    Social History   Socioeconomic History  . Marital status: Married    Spouse name: Not on file  . Number of children: Not on file  . Years of education: Not on file  . Highest education level: Not on file  Occupational History  . Not on file  Social Needs  . Financial resource strain: Not on file  . Food insecurity:    Worry: Not on file    Inability: Not on file  . Transportation needs:    Medical: Not on file    Non-medical: Not on file  Tobacco Use  . Smoking status: Never Smoker  . Smokeless tobacco: Never Used  Substance and Sexual Activity  . Alcohol use: Yes    Comment: occasionally  . Drug use: No  . Sexual activity: Yes    Birth control/protection: I.U.D.  Lifestyle  . Physical activity:    Days per week: Not on file    Minutes per session: Not on file  . Stress: Not on file  Relationships  . Social connections:    Talks on phone: Not on file    Gets together: Not on file    Attends religious service: Not on file    Active member of club or organization: Not on file    Attends meetings of clubs or organizations: Not on file    Relationship status: Not on file  . Intimate partner violence:    Fear of current or ex partner: Not on file    Emotionally abused: Not  on file    Physically abused: Not on file    Forced sexual activity: Not on file  Other Topics Concern  . 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 TONSILLECTOMY     38 y.o     Family History  Problem Relation Age of Onset  . Diabetes Mother   . Hyperlipidemia Mother   . Hypertension Mother   . Hyperlipidemia Father   . Diabetes Maternal Uncle   . Hyperlipidemia Maternal Grandmother   . Cancer Maternal Grandfather        lung smoker  . Cancer Paternal Grandfather        colon  . Diabetes Maternal Uncle     No Known Allergies  Current Outpatient Medications on File Prior to Visit  Medication Sig Dispense Refill  . Biotin 5000 MCG TABS Take by mouth daily.    . Calcium-Phosphorus-Vitamin  D (CITRACAL +D3 PO) Take by mouth daily.    . Multiple Vitamin (MULTIVITAMIN) tablet Take 1 tablet by mouth daily.     No current facility-administered medications on file prior to visit.     BP 110/80   Temp 98.8 F (37.1 C)   Wt 128 lb (58.1 kg)   BMI 25.85 kg/m       Objective:   Physical Exam Vitals signs and nursing note reviewed.  Constitutional:      Appearance: Normal appearance. She is diaphoretic.  HENT:     Right Ear: Tympanic membrane, ear canal and external ear normal. There is no impacted cerumen.     Left Ear: Tympanic membrane, ear canal and external ear normal. There is no impacted cerumen.     Nose: Rhinorrhea present. Rhinorrhea is clear.     Right Turbinates: Not enlarged or swollen.     Left Turbinates: Not enlarged or swollen.     Right Sinus: Maxillary sinus tenderness and frontal sinus tenderness present.     Left Sinus: Maxillary sinus tenderness and frontal sinus tenderness present.     Mouth/Throat:     Mouth: Mucous membranes are moist.     Pharynx: Oropharynx is clear.  Eyes:       Extraocular Movements: Extraocular movements intact.     Pupils: Pupils are equal, round, and reactive to light.  Cardiovascular:     Rate and Rhythm: Normal rate and regular rhythm.     Pulses: Normal pulses.     Heart sounds: Normal heart sounds.  Pulmonary:     Effort: Pulmonary effort is normal.     Breath sounds: Normal breath sounds.  Skin:    General: Skin is warm.     Capillary Refill: Capillary refill takes less than 2 seconds.  Neurological:     General: No focal deficit present.     Mental Status: She is alert and oriented to person, place, and time.  Psychiatric:        Mood and Affect: Mood normal.        Behavior: Behavior normal.        Thought Content: Thought content normal.        Judgment: Judgment normal.        Assessment & Plan:  1. Viral sinusitis Signs and symptoms more of sinusitis than flu. Likely viral. Advised rest and hydration. Start using flonase.  - Follow up if no improvement in the next 2-3 days - POC Influenza A/B- negative   2. Cough  - HYDROcodone-homatropine (HYCODAN) 5-1.5 MG/5ML syrup; Take 5 mLs by mouth every 8 (eight) hours as needed for cough.  Dispense: 120 mL; Refill: 0   Dorothyann Peng, NP

## 2018-09-22 ENCOUNTER — Other Ambulatory Visit: Payer: Self-pay | Admitting: Adult Health

## 2018-09-22 ENCOUNTER — Encounter: Payer: Self-pay | Admitting: Internal Medicine

## 2018-09-22 MED ORDER — DOXYCYCLINE HYCLATE 100 MG PO CAPS: 100.0000 mg | ORAL_CAPSULE | Freq: Two times a day (BID) | ORAL | 0 refills | Status: DC

## 2018-09-26 ENCOUNTER — Encounter: Payer: Self-pay | Admitting: Plastic Surgery

## 2018-09-26 ENCOUNTER — Ambulatory Visit: Payer: 59 | Admitting: Plastic Surgery

## 2018-09-26 VITALS — BP 129/79 | HR 67 | Temp 98.6°F | Ht <= 58 in | Wt 128.8 lb

## 2018-09-26 DIAGNOSIS — D171 Benign lipomatous neoplasm of skin and subcutaneous tissue of trunk: Secondary | ICD-10-CM | POA: Diagnosis not present

## 2018-09-26 NOTE — Progress Notes (Signed)
Patient ID: Annette Gutierrez, female    DOB: 1980-11-29, 38 y.o.   MRN: [id number]   Chief Complaint  Patient presents with  . Advice Only    for Lipoma on (L) shoulder blade    The patient is a 38 year old black female here for evaluation of her back.  The patient states that she has noticed a lipoma on the left posterior aspect of her thorax for several years.  Over the past year it is getting much bigger.  She now notices that she does not want to sit back in her chair or when she drives.  The pressure is painful or irritating.  It is approximately 7 x 7 cm in size.  Nothing makes it better.  It gets larger with time.  She is otherwise in very good health.  She is not had any masses or tumors in the past.   Review of Systems  Constitutional: Negative for activity change and appetite change.  HENT: Negative.   Eyes: Negative.   Respiratory: Negative.   Cardiovascular: Negative.  Negative for leg swelling.  Gastrointestinal: Negative.  Negative for abdominal pain.  Genitourinary: Negative.   Musculoskeletal: Positive for back pain.  Hematological: Negative.   Psychiatric/Behavioral: Negative.     Past Medical History:  Diagnosis Date  . Blood in stool   . Lipoma of back   . Vestibular neuritis    2010    Past Surgical History:  Procedure Laterality Date  . TONSILLECTOMY     38 y.o       Current Outpatient Medications:  .  Biotin 5000 MCG TABS, Take by mouth daily., Disp: , Rfl:  .  Calcium-Phosphorus-Vitamin D (CITRACAL +D3 PO), Take by mouth daily., Disp: , Rfl:  .  doxycycline (VIBRAMYCIN) 100 MG capsule, Take 1 capsule (100 mg total) by mouth 2 (two) times daily., Disp: 14 capsule, Rfl: 0 .  HYDROcodone-homatropine (HYCODAN) 5-1.5 MG/5ML syrup, Take 5 mLs by mouth every 8 (eight) hours as needed for cough., Disp: 120 mL, Rfl: 0 .  Multiple Vitamin (MULTIVITAMIN) tablet, Take 1 tablet by mouth daily., Disp: , Rfl:    Objective:   Vitals:   09/26/18 1438  BP: 129/79   Pulse: 67  Temp: 98.6 F (37 C)  SpO2: 100%    Physical Exam Vitals signs and nursing note reviewed.  Constitutional:      Appearance: Normal appearance.  HENT:     Head: Normocephalic and atraumatic.     Nose: Nose normal.     Mouth/Throat:     Mouth: Mucous membranes are moist.  Neck:     Musculoskeletal: Normal range of motion.  Cardiovascular:     Rate and Rhythm: Normal rate.  Pulmonary:     Effort: Pulmonary effort is normal.  Abdominal:     General: Abdomen is flat. There is no distension.     Tenderness: There is no abdominal tenderness.  Skin:    General: Skin is warm.     Capillary Refill: Capillary refill takes less than 2 seconds.  Neurological:     General: No focal deficit present.     Mental Status: She is alert.  Psychiatric:        Mood and Affect: Mood normal.        Behavior: Behavior normal.        Thought Content: Thought content normal.     Assessment & Plan:  Lipoma of torso Plan for excision of left back lipoma.  Lyndee Leo  S Tena Linebaugh, DO

## 2018-10-03 ENCOUNTER — Ambulatory Visit (INDEPENDENT_AMBULATORY_CARE_PROVIDER_SITE_OTHER): Payer: 59

## 2018-10-03 ENCOUNTER — Ambulatory Visit: Payer: 59 | Admitting: Adult Health

## 2018-10-03 ENCOUNTER — Other Ambulatory Visit: Payer: Self-pay

## 2018-10-03 ENCOUNTER — Encounter: Payer: Self-pay | Admitting: Family Medicine

## 2018-10-03 ENCOUNTER — Ambulatory Visit: Payer: 59 | Admitting: Family Medicine

## 2018-10-03 VITALS — BP 110/62 | HR 108 | Temp 99.7°F | Wt 128.2 lb

## 2018-10-03 DIAGNOSIS — R6889 Other general symptoms and signs: Secondary | ICD-10-CM

## 2018-10-03 DIAGNOSIS — R059 Cough, unspecified: Secondary | ICD-10-CM

## 2018-10-03 DIAGNOSIS — J111 Influenza due to unidentified influenza virus with other respiratory manifestations: Secondary | ICD-10-CM

## 2018-10-03 DIAGNOSIS — R05 Cough: Secondary | ICD-10-CM

## 2018-10-03 LAB — POC INFLUENZA A&B (BINAX/QUICKVUE)
INFLUENZA B, POC: NEGATIVE
Influenza A, POC: NEGATIVE

## 2018-10-03 MED ORDER — HYDROCODONE-HOMATROPINE 5-1.5 MG/5ML PO SYRP: 5.0000 mL | ORAL_SOLUTION | Freq: Three times a day (TID) | ORAL | 0 refills | Status: DC | PRN

## 2018-10-03 NOTE — Progress Notes (Signed)
  Subjective:     Patient ID: Annette Gutierrez, female   DOB: 1980/09/28, 38 y.o.   MRN: [id number]  HPI   Patient is seen with cough for couple weeks duration.  She was seen here couple weeks ago and treated with doxycycline and given cough medicine with Hycodan.  Other than cough, she has relatively no major symptoms.  No chills.  No documented fever.  No recent travels.  No dyspnea.  No nausea, vomiting, or diarrhea.  No chronic lung issues.  No recent travels.    Past Medical History:  Diagnosis Date  . Blood in stool   . Lipoma of back   . Vestibular neuritis    2010   Past Surgical History:  Procedure Laterality Date  . TONSILLECTOMY     38 y.o     reports that she has never smoked. She has never used smokeless tobacco. She reports current alcohol use. She reports that she does not use drugs. family history includes Cancer in her maternal grandfather and paternal grandfather; Diabetes in her maternal uncle, maternal uncle, and mother; Hyperlipidemia in her father, maternal grandmother, and mother; Hypertension in her mother. No Known Allergies   Review of Systems  Constitutional: Negative for appetite change, chills, fever and unexpected weight change.  HENT: Negative for postnasal drip.   Respiratory: Positive for cough. Negative for shortness of breath and wheezing.   Cardiovascular: Negative for chest pain.       Objective:   Physical Exam Constitutional:      Appearance: Normal appearance.  HENT:     Right Ear: Tympanic membrane normal.     Left Ear: Tympanic membrane normal.  Cardiovascular:     Rate and Rhythm: Normal rate and regular rhythm.  Pulmonary:     Effort: Pulmonary effort is normal.     Breath sounds: Normal breath sounds. No wheezing or rales.  Neurological:     Mental Status: She is alert.        Assessment:     Persistent cough.  Suspect probably acute viral bronchitis.  Flu screen negative.  She is not reporting fevers but temperature here  today is 99.7.  No dyspnea.   Nonfocal exam.  No respiratory distress.    Plan:     -Obtain chest x-ray to further assess=no pneumonia or other acute abnormality noted.  This will be over-read. -refilled Hycodan cough syrup for as needed night use.  -follow up for any fever or increased shortness of breath.  Eulas Post MD [facility]

## 2018-10-03 NOTE — Progress Notes (Signed)
poc

## 2018-10-03 NOTE — Patient Instructions (Signed)
Follow up for any fever or increased shortness of breath. 

## 2018-10-31 DIAGNOSIS — N760 Acute vaginitis: Secondary | ICD-10-CM | POA: Diagnosis not present

## 2018-11-21 ENCOUNTER — Ambulatory Visit (INDEPENDENT_AMBULATORY_CARE_PROVIDER_SITE_OTHER): Payer: 59 | Admitting: Internal Medicine

## 2018-11-21 DIAGNOSIS — L700 Acne vulgaris: Secondary | ICD-10-CM

## 2018-11-21 HISTORY — DX: Acne vulgaris: L70.0

## 2018-11-21 MED ORDER — DOXYCYCLINE HYCLATE 100 MG PO TABS: 100.0000 mg | ORAL_TABLET | Freq: Every day | ORAL | 2 refills | Status: DC

## 2018-11-21 MED ORDER — CLINDAMYCIN-TRETINOIN 1.2-0.025 % EX GEL: Freq: Every day | CUTANEOUS | 11 refills | Status: DC

## 2018-11-21 MED ORDER — FLUCONAZOLE 150 MG PO TABS: 150.0000 mg | ORAL_TABLET | Freq: Once | ORAL | 0 refills | Status: AC

## 2018-11-21 MED FILL — DOXYCYCLINE HYCLATE 100 MG: 100 | 30 days supply | Qty: 30 | Fill #0

## 2018-11-21 MED FILL — FLUCONAZOLE 150 MG TABS: 150 | 4 days supply | Qty: 4 | Fill #0

## 2018-11-21 NOTE — Progress Notes (Signed)
Virtual Visit via Video Note  I connected with Annette Gutierrez   on 11/21/18 at  9:32 AM EDT by a video enabled telemedicine application and verified that I am speaking with the correct person using two identifiers.  Location patient: work [employer]  Persons participating in the virtual visit: patient, provider  I discussed the limitations of evaluation and management by telemedicine and the availability of in person appointments. The patient expressed understanding and agreed to proceed.   HPI: Doing well c/o acne to face cheeks with cystic ance in the past used minocycline which helped and using mary K products now for acne taking vitamins and increased water w/o improvement   Reviewed labs 09/05/2018   ROS: See pertinent positives and negatives per HPI.  Past Medical History:  Diagnosis Date  . Blood in stool   . Lipoma of back   . Vestibular neuritis    2010    Past Surgical History:  Procedure Laterality Date  . TONSILLECTOMY     38 y.o     Family History  Problem Relation Age of Onset  . Diabetes Mother   . Hyperlipidemia Mother   . Hypertension Mother   . Hyperlipidemia Father   . Diabetes Maternal Uncle   . Hyperlipidemia Maternal Grandmother   . Cancer Maternal Grandfather        lung smoker  . Cancer Paternal Grandfather        colon  . Diabetes Maternal Uncle     SOCIAL HX: supervisor at [employer]    Current Outpatient Medications:  .  Biotin 5000 MCG TABS, Take by mouth daily., Disp: , Rfl:  .  Calcium-Phosphorus-Vitamin D (CITRACAL +D3 PO), Take by mouth daily., Disp: , Rfl:  .  clindamycin-tretinoin (ZIANA) gel, Apply topically at bedtime. With moisturizer, Disp: 60 g, Rfl: 11 .  doxycycline (VIBRA-TABS) 100 MG tablet, Take 1 tablet (100 mg total) by mouth daily., Disp: 30 tablet, Rfl: 2 .  fluconazole (DIFLUCAN) 150 MG tablet, Take 1 tablet (150 mg total) by mouth once for 1 dose. Single dose as needed for yeast infection, Disp: 4 tablet,  Rfl: 0 .  HYDROcodone-homatropine (HYCODAN) 5-1.5 MG/5ML syrup, Take 5 mLs by mouth every 8 (eight) hours as needed for cough., Disp: 120 mL, Rfl: 0 .  Multiple Vitamin (MULTIVITAMIN) tablet, Take 1 tablet by mouth daily., Disp: , Rfl:   EXAM:  VITALS per patient if applicable:  GENERAL: alert, oriented, appears well and in no acute distress  HEENT: atraumatic, conjunttiva clear, no obvious abnormalities on inspection of external nose and ears  NECK: normal movements of the head and neck  LUNGS: on inspection no signs of respiratory distress, breathing rate appears normal, no obvious gross SOB, gasping or wheezing  CV: no obvious cyanosis  MS: moves all visible extremities without noticeable abnormality  PSYCH/NEURO: pleasant and cooperative, no obvious depression or anxiety, speech and thought processing grossly intact  Skin: cystic acne worse on right cheek than left   ASSESSMENT AND PLAN:  Discussed the following assessment and plan:  Acne vulgaris - Plan: doxycycline (VIBRA-TABS) 100 MG tablet qd pt to try for 1 month max 3 months but does get yeast infections with antibiotics, clindamycin-tretinoin (ZIANA) gel qhs, fluconazole (DIFLUCAN) 150 MG tablet 1 dose prn  Disc cerave products otc and [facility]   HM-physical at f/u  Flu shot had 03/2018 at work  Tdap 11/13/16  Pap had 07/07/16 get records h/o abnormal pap 2006 s/p colp with nl  paps since  Colonoscopy 2018-ask at f/u and get if had done  Per pt hep C testing neg 10/2017   Dr. Sheran Fava dental  Previous PCP Abundio Miu NP [facility] seen 03/2018  Ob/gyn Sadie Haber Christophe Louis get pap records had 07/07/16      I discussed the assessment and treatment plan with the patient. The patient was provided an opportunity to ask questions and all were answered. The patient agreed with the plan and demonstrated an understanding of the instructions.   The patient was advised to call back or seek an in-person evaluation if the  symptoms worsen or if the condition fails to improve as anticipated.  Time spent 15 min.  Nino Glow McLean-Scocuzza, MD

## 2018-11-22 ENCOUNTER — Other Ambulatory Visit: Payer: Self-pay | Admitting: Internal Medicine

## 2018-11-22 ENCOUNTER — Encounter: Payer: Self-pay | Admitting: Internal Medicine

## 2018-11-22 DIAGNOSIS — L700 Acne vulgaris: Secondary | ICD-10-CM

## 2018-11-22 MED ORDER — CLINDAMYCIN PHOSPHATE 1 % EX GEL: Freq: Two times a day (BID) | CUTANEOUS | 12 refills | Status: DC

## 2018-11-22 MED ORDER — TRETINOIN 0.025 % EX CREA: TOPICAL_CREAM | Freq: Every day | CUTANEOUS | 11 refills | Status: DC

## 2018-11-22 MED FILL — CLINDAMYCIN PH 1% GEL: 1 | 30 days supply | Qty: 60 | Fill #0

## 2018-11-25 DIAGNOSIS — H5213 Myopia, bilateral: Secondary | ICD-10-CM | POA: Diagnosis not present

## 2018-11-25 DIAGNOSIS — H52223 Regular astigmatism, bilateral: Secondary | ICD-10-CM | POA: Diagnosis not present

## 2018-11-25 DIAGNOSIS — Z135 Encounter for screening for eye and ear disorders: Secondary | ICD-10-CM | POA: Diagnosis not present

## 2018-11-30 ENCOUNTER — Telehealth: Payer: Self-pay | Admitting: Internal Medicine

## 2018-11-30 NOTE — Telephone Encounter (Signed)
Please check on tretinoin prior authorization and call [name]

## 2018-12-01 NOTE — Telephone Encounter (Signed)
Medication pa is still pending.

## 2018-12-05 ENCOUNTER — Telehealth: Payer: Self-pay

## 2018-12-05 MED FILL — TRETINOIN 0.025 % CREA: 0.025 | 30 days supply | Qty: 45 | Fill #0

## 2018-12-05 NOTE — Telephone Encounter (Signed)
Left message with pt's mom to let her know her medication was approved and she should be able to pick up.  Lawonda Pretlow,cma

## 2018-12-14 ENCOUNTER — Telehealth: Payer: Self-pay | Admitting: Plastic Surgery

## 2018-12-14 ENCOUNTER — Encounter (HOSPITAL_BASED_OUTPATIENT_CLINIC_OR_DEPARTMENT_OTHER): Payer: Self-pay | Admitting: *Deleted

## 2018-12-14 ENCOUNTER — Other Ambulatory Visit: Payer: Self-pay

## 2018-12-14 NOTE — Telephone Encounter (Signed)

## 2018-12-15 ENCOUNTER — Encounter: Payer: Self-pay | Admitting: Plastic Surgery

## 2018-12-15 ENCOUNTER — Ambulatory Visit (INDEPENDENT_AMBULATORY_CARE_PROVIDER_SITE_OTHER): Payer: 59 | Admitting: Plastic Surgery

## 2018-12-15 VITALS — BP 136/86 | HR 60 | Temp 98.9°F | Ht <= 58 in

## 2018-12-15 DIAGNOSIS — D171 Benign lipomatous neoplasm of skin and subcutaneous tissue of trunk: Secondary | ICD-10-CM

## 2018-12-15 MED ORDER — ONDANSETRON HCL 4 MG PO TABS: 4.0000 mg | ORAL_TABLET | Freq: Three times a day (TID) | ORAL | 0 refills | Status: AC | PRN

## 2018-12-15 MED ORDER — HYDROCODONE-ACETAMINOPHEN 5-325 MG PO TABS: 1.0000 | ORAL_TABLET | Freq: Four times a day (QID) | ORAL | 0 refills | Status: AC | PRN

## 2018-12-15 NOTE — H&P (View-Only) (Signed)
Patient ID: Annette Gutierrez, female    DOB: [birth year], 38 y.o.   MRN: [id number]   Chief Complaint  Patient presents with  . Skin Problem    The patient is a 38 year old female here for her history and physical for surgery next week.  She is scheduled to have a lipoma excised from her left back area.  It had been getting larger.  Overall she is doing well.  She is been very careful about limiting her exposure to COVID.  She works at all of our primary care office.  She denies any symptoms.  She is looking forward to having this removed.   Review of Systems  Constitutional: Negative.  Negative for activity change and appetite change.  HENT: Negative.   Eyes: Negative.   Respiratory: Negative.  Negative for shortness of breath.   Cardiovascular: Negative.  Negative for leg swelling.  Gastrointestinal: Negative.  Negative for abdominal pain.  Genitourinary: Negative.   Musculoskeletal: Negative.  Negative for joint swelling.  Psychiatric/Behavioral: Negative.     Past Medical History:  Diagnosis Date  . Blood in stool   . Lipoma of back   . Vestibular neuritis    2010    History reviewed. No pertinent surgical history.    Current Outpatient Medications:  .  Biotin 5000 MCG TABS, Take by mouth daily., Disp: , Rfl:  .  Calcium-Phosphorus-Vitamin D (CITRACAL +D3 PO), Take by mouth daily., Disp: , Rfl:  .  clindamycin (CLINDAGEL) 1 % gel, Apply topically 2 (two) times daily., Disp: 60 g, Rfl: 12 .  doxycycline (VIBRA-TABS) 100 MG tablet, Take 1 tablet (100 mg total) by mouth daily., Disp: 30 tablet, Rfl: 2 .  HYDROcodone-acetaminophen (NORCO) 5-325 MG tablet, Take 1 tablet by mouth every 6 (six) hours as needed for up to 5 days., Disp: 20 tablet, Rfl: 0 .  levonorgestrel (MIRENA) 20 MCG/24HR IUD, 1 each by Intrauterine route once., Disp: , Rfl:  .  Multiple Vitamin (MULTIVITAMIN) tablet, Take 1 tablet by mouth daily., Disp: , Rfl:  .  ondansetron (ZOFRAN) 4 MG tablet, Take 1  tablet (4 mg total) by mouth every 8 (eight) hours as needed for up to 5 days., Disp: 15 tablet, Rfl: 0 .  tretinoin (RETIN-A) 0.025 % cream, Apply topically at bedtime. qhs, Disp: 45 g, Rfl: 11   Objective:   Vitals:   12/15/18 1146  BP: 136/86  Pulse: (!) 59  Temp: 98.9 F (37.2 C)  SpO2: 100%    Physical Exam Vitals signs and nursing note reviewed.  Constitutional:      Appearance: Normal appearance.  HENT:     Head: Normocephalic and atraumatic.  Cardiovascular:     Rate and Rhythm: Normal rate.     Pulses: Normal pulses.  Pulmonary:     Effort: Pulmonary effort is normal. No respiratory distress.  Abdominal:     General: Abdomen is flat. There is no distension.     Tenderness: There is no abdominal tenderness.  Musculoskeletal:       Arms:  Skin:    General: Skin is warm.     Capillary Refill: Capillary refill takes less than 2 seconds.  Neurological:     General: No focal deficit present.     Mental Status: She is alert and oriented to person, place, and time.  Psychiatric:        Mood and Affect: Mood normal.        Behavior: Behavior normal.  Assessment & Plan:  Lipoma of torso Plan for excision of back left lipoma. Prescription sent into pharmacy.  The risks that can be encountered with and after excision of a lesion were discussed and include the following but not limited to these: bleeding, infection, delayed healing, anesthesia risks, skin sensation changes, injury to structures including nerves, blood vessels, and muscles which may be temporary or permanent, allergies to tape, suture materials and glues, blood products, topical preparations or injected agents, skin contour irregularities, skin discoloration and swelling, deep vein thrombosis, cardiac and pulmonary complications, pain, which may persist, persistent pain, recurrence of the lesion, poor healing of the incision, possible need for revisional surgery or staged procedures.    [place], DO

## 2018-12-15 NOTE — Progress Notes (Signed)
Patient ID: Annette Gutierrez, female    DOB: 1980-12-28, 38 y.o.   MRN: [id number]   Chief Complaint  Patient presents with  . Skin Problem    The patient is a 38 year old female here for her history and physical for surgery next week.  She is scheduled to have a lipoma excised from her left back area.  It had been getting larger.  Overall she is doing well.  She is been very careful about limiting her exposure to COVID.  She works at all of our primary care office.  She denies any symptoms.  She is looking forward to having this removed.   Review of Systems  Constitutional: Negative.  Negative for activity change and appetite change.  HENT: Negative.   Eyes: Negative.   Respiratory: Negative.  Negative for shortness of breath.   Cardiovascular: Negative.  Negative for leg swelling.  Gastrointestinal: Negative.  Negative for abdominal pain.  Genitourinary: Negative.   Musculoskeletal: Negative.  Negative for joint swelling.  Psychiatric/Behavioral: Negative.     Past Medical History:  Diagnosis Date  . Blood in stool   . Lipoma of back   . Vestibular neuritis    2010    History reviewed. No pertinent surgical history.    Current Outpatient Medications:  .  Biotin 5000 MCG TABS, Take by mouth daily., Disp: , Rfl:  .  Calcium-Phosphorus-Vitamin D (CITRACAL +D3 PO), Take by mouth daily., Disp: , Rfl:  .  clindamycin (CLINDAGEL) 1 % gel, Apply topically 2 (two) times daily., Disp: 60 g, Rfl: 12 .  doxycycline (VIBRA-TABS) 100 MG tablet, Take 1 tablet (100 mg total) by mouth daily., Disp: 30 tablet, Rfl: 2 .  HYDROcodone-acetaminophen (NORCO) 5-325 MG tablet, Take 1 tablet by mouth every 6 (six) hours as needed for up to 5 days., Disp: 20 tablet, Rfl: 0 .  levonorgestrel (MIRENA) 20 MCG/24HR IUD, 1 each by Intrauterine route once., Disp: , Rfl:  .  Multiple Vitamin (MULTIVITAMIN) tablet, Take 1 tablet by mouth daily., Disp: , Rfl:  .  ondansetron (ZOFRAN) 4 MG tablet, Take 1  tablet (4 mg total) by mouth every 8 (eight) hours as needed for up to 5 days., Disp: 15 tablet, Rfl: 0 .  tretinoin (RETIN-A) 0.025 % cream, Apply topically at bedtime. qhs, Disp: 45 g, Rfl: 11   Objective:   Vitals:   12/15/18 1146  BP: 136/86  Pulse: (!) 59  Temp: 98.9 F (37.2 C)  SpO2: 100%    Physical Exam Vitals signs and nursing note reviewed.  Constitutional:      Appearance: Normal appearance.  HENT:     Head: Normocephalic and atraumatic.  Cardiovascular:     Rate and Rhythm: Normal rate.     Pulses: Normal pulses.  Pulmonary:     Effort: Pulmonary effort is normal. No respiratory distress.  Abdominal:     General: Abdomen is flat. There is no distension.     Tenderness: There is no abdominal tenderness.  Musculoskeletal:       Arms:  Skin:    General: Skin is warm.     Capillary Refill: Capillary refill takes less than 2 seconds.  Neurological:     General: No focal deficit present.     Mental Status: She is alert and oriented to person, place, and time.  Psychiatric:        Mood and Affect: Mood normal.        Behavior: Behavior normal.  Assessment & Plan:  Lipoma of torso Plan for excision of back left lipoma. Prescription sent into pharmacy.  The risks that can be encountered with and after excision of a lesion were discussed and include the following but not limited to these: bleeding, infection, delayed healing, anesthesia risks, skin sensation changes, injury to structures including nerves, blood vessels, and muscles which may be temporary or permanent, allergies to tape, suture materials and glues, blood products, topical preparations or injected agents, skin contour irregularities, skin discoloration and swelling, deep vein thrombosis, cardiac and pulmonary complications, pain, which may persist, persistent pain, recurrence of the lesion, poor healing of the incision, possible need for revisional surgery or staged procedures.    [place], DO

## 2018-12-18 ENCOUNTER — Other Ambulatory Visit (HOSPITAL_COMMUNITY)
Admission: RE | Admit: 2018-12-18 | Discharge: 2018-12-18 | Disposition: A | Discharge status: Home / Self Care | Payer: 59 | Source: Ambulatory Visit | Attending: Plastic Surgery | Admitting: Plastic Surgery

## 2018-12-18 ENCOUNTER — Other Ambulatory Visit: Payer: Self-pay

## 2018-12-18 DIAGNOSIS — Z1159 Encounter for screening for other viral diseases: Secondary | ICD-10-CM | POA: Insufficient documentation

## 2018-12-20 LAB — NOVEL CORONAVIRUS, NAA (HOSP ORDER, SEND-OUT TO REF LAB; TAT 18-24 HRS): SARS-CoV-2, NAA: NOT DETECTED

## 2018-12-20 NOTE — Anesthesia Preprocedure Evaluation (Addendum)
Anesthesia Evaluation  Patient identified by MRN, date of birth, ID band Patient awake    Reviewed: Allergy & Precautions, NPO status , Patient's Chart, lab work & pertinent test results  Airway Mallampati: I  TM Distance: >3 FB Neck ROM: Full    Dental no notable dental hx.    Pulmonary neg pulmonary ROS,    Pulmonary exam normal breath sounds clear to auscultation       Cardiovascular Exercise Tolerance: Good negative cardio ROS Normal cardiovascular exam Rhythm:Regular Rate:Normal     Neuro/Psych negative psych ROS   GI/Hepatic negative GI ROS, Neg liver ROS,   Endo/Other  negative endocrine ROS  Renal/GU negative Renal ROS     Musculoskeletal negative musculoskeletal ROS (+)   Abdominal   Peds  Hematology negative hematology ROS (+)   Anesthesia Other Findings   Reproductive/Obstetrics                            Anesthesia Physical Anesthesia Plan  ASA: I  Anesthesia Plan: General   Post-op Pain Management:    Induction: Intravenous  PONV Risk Score and Plan: 3 and Treatment may vary due to age or medical condition, Ondansetron and Dexamethasone  Airway Management Planned: LMA  Additional Equipment:   Intra-op Plan:   Post-operative Plan:   Informed Consent: I have reviewed the patients History and Physical, chart, labs and discussed the procedure including the risks, benefits and alternatives for the proposed anesthesia with the patient or authorized representative who has indicated his/her understanding and acceptance.     Dental advisory given  Plan Discussed with: CRNA  Anesthesia Plan Comments:       Anesthesia Quick Evaluation

## 2018-12-21 ENCOUNTER — Ambulatory Visit (HOSPITAL_BASED_OUTPATIENT_CLINIC_OR_DEPARTMENT_OTHER): Payer: 59 | Admitting: Anesthesiology

## 2018-12-21 ENCOUNTER — Encounter (HOSPITAL_BASED_OUTPATIENT_CLINIC_OR_DEPARTMENT_OTHER)
Admission: RE | Disposition: A | Discharge status: Home / Self Care | Payer: Self-pay | Source: Home / Self Care | Attending: Plastic Surgery

## 2018-12-21 ENCOUNTER — Ambulatory Visit (HOSPITAL_BASED_OUTPATIENT_CLINIC_OR_DEPARTMENT_OTHER)
Admission: RE | Admit: 2018-12-21 | Discharge: 2018-12-21 | Disposition: A | Discharge status: Home / Self Care | Payer: 59 | Attending: Plastic Surgery | Admitting: Plastic Surgery

## 2018-12-21 ENCOUNTER — Encounter (HOSPITAL_BASED_OUTPATIENT_CLINIC_OR_DEPARTMENT_OTHER): Payer: Self-pay

## 2018-12-21 ENCOUNTER — Other Ambulatory Visit: Payer: Self-pay

## 2018-12-21 DIAGNOSIS — D1722 Benign lipomatous neoplasm of skin and subcutaneous tissue of left arm: Secondary | ICD-10-CM | POA: Diagnosis not present

## 2018-12-21 DIAGNOSIS — D171 Benign lipomatous neoplasm of skin and subcutaneous tissue of trunk: Secondary | ICD-10-CM | POA: Diagnosis not present

## 2018-12-21 DIAGNOSIS — Z79899 Other long term (current) drug therapy: Secondary | ICD-10-CM | POA: Diagnosis not present

## 2018-12-21 DIAGNOSIS — L700 Acne vulgaris: Secondary | ICD-10-CM | POA: Diagnosis not present

## 2018-12-21 DIAGNOSIS — Z793 Long term (current) use of hormonal contraceptives: Secondary | ICD-10-CM | POA: Insufficient documentation

## 2018-12-21 HISTORY — PX: LIPOMA EXCISION: SHX5283

## 2018-12-21 SURGERY — EXCISION LIPOMA
Anesthesia: General | Site: Back | Laterality: Left

## 2018-12-21 MED ORDER — SODIUM CHLORIDE 0.9 % IV SOLN: 250.0000 mL | INTRAVENOUS | Status: DC | PRN

## 2018-12-21 MED ORDER — SUCCINYLCHOLINE CHLORIDE 200 MG/10ML IV SOSY
PREFILLED_SYRINGE | INTRAVENOUS | Status: AC
  Filled 2018-12-21: qty 10

## 2018-12-21 MED ORDER — SODIUM CHLORIDE 0.9% FLUSH: 3.0000 mL | INTRAVENOUS | Status: DC | PRN

## 2018-12-21 MED ORDER — LIDOCAINE-EPINEPHRINE 1 %-1:100000 IJ SOLN
INTRAMUSCULAR | Status: AC
  Filled 2018-12-21: qty 1

## 2018-12-21 MED ORDER — OXYCODONE HCL 5 MG PO TABS: 5.0000 mg | ORAL_TABLET | ORAL | Status: DC | PRN

## 2018-12-21 MED ORDER — ACETAMINOPHEN 650 MG RE SUPP: 650.0000 mg | RECTAL | Status: DC | PRN

## 2018-12-21 MED ORDER — PHENYLEPHRINE 40 MCG/ML (10ML) SYRINGE FOR IV PUSH (FOR BLOOD PRESSURE SUPPORT)
PREFILLED_SYRINGE | INTRAVENOUS | Status: AC
  Filled 2018-12-21: qty 10

## 2018-12-21 MED ORDER — BUPIVACAINE-EPINEPHRINE 0.25% -1:200000 IJ SOLN
INTRAMUSCULAR | Status: DC | PRN
  Administered 2018-12-21: 2 mL

## 2018-12-21 MED ORDER — DEXAMETHASONE SODIUM PHOSPHATE 10 MG/ML IJ SOLN
INTRAMUSCULAR | Status: AC
  Filled 2018-12-21: qty 1

## 2018-12-21 MED ORDER — BUPIVACAINE HCL (PF) 0.25 % IJ SOLN
INTRAMUSCULAR | Status: AC
  Filled 2018-12-21: qty 30

## 2018-12-21 MED ORDER — CHLORHEXIDINE GLUCONATE 4 % EX LIQD: 1.0000 "application " | Freq: Once | CUTANEOUS | Status: DC

## 2018-12-21 MED ORDER — ACETAMINOPHEN 10 MG/ML IV SOLN: 1000.0000 mg | Freq: Once | INTRAVENOUS | Status: DC | PRN

## 2018-12-21 MED ORDER — SODIUM CHLORIDE 0.9% FLUSH: 3.0000 mL | Freq: Two times a day (BID) | INTRAVENOUS | Status: DC

## 2018-12-21 MED ORDER — FENTANYL CITRATE (PF) 100 MCG/2ML IJ SOLN
50.0000 ug | INTRAMUSCULAR | Status: DC | PRN
  Administered 2018-12-21: 100 ug via INTRAVENOUS

## 2018-12-21 MED ORDER — LIDOCAINE 2% (20 MG/ML) 5 ML SYRINGE
INTRAMUSCULAR | Status: AC
  Filled 2018-12-21: qty 5

## 2018-12-21 MED ORDER — ONDANSETRON HCL 4 MG/2ML IJ SOLN
INTRAMUSCULAR | Status: DC | PRN
  Administered 2018-12-21: 4 mg via INTRAVENOUS

## 2018-12-21 MED ORDER — EPHEDRINE 5 MG/ML INJ
INTRAVENOUS | Status: AC
  Filled 2018-12-21: qty 10

## 2018-12-21 MED ORDER — ACETAMINOPHEN 325 MG PO TABS: 650.0000 mg | ORAL_TABLET | ORAL | Status: DC | PRN

## 2018-12-21 MED ORDER — FENTANYL CITRATE (PF) 100 MCG/2ML IJ SOLN
INTRAMUSCULAR | Status: AC
  Filled 2018-12-21: qty 2

## 2018-12-21 MED ORDER — BUPIVACAINE-EPINEPHRINE (PF) 0.25% -1:200000 IJ SOLN
INTRAMUSCULAR | Status: AC
  Filled 2018-12-21: qty 30

## 2018-12-21 MED ORDER — CEFAZOLIN SODIUM-DEXTROSE 2-4 GM/100ML-% IV SOLN
2.0000 g | INTRAVENOUS | Status: AC
  Administered 2018-12-21: 2 g via INTRAVENOUS

## 2018-12-21 MED ORDER — MIDAZOLAM HCL 2 MG/2ML IJ SOLN
1.0000 mg | INTRAMUSCULAR | Status: DC | PRN
  Administered 2018-12-21: 07:00:00 2 mg via INTRAVENOUS

## 2018-12-21 MED ORDER — ONDANSETRON HCL 4 MG/2ML IJ SOLN: 4.0000 mg | Freq: Once | INTRAMUSCULAR | Status: DC | PRN

## 2018-12-21 MED ORDER — DEXAMETHASONE SODIUM PHOSPHATE 4 MG/ML IJ SOLN
INTRAMUSCULAR | Status: DC | PRN
  Administered 2018-12-21: 8 mg via INTRAVENOUS

## 2018-12-21 MED ORDER — LIDOCAINE HCL (PF) 1 % IJ SOLN
INTRAMUSCULAR | Status: AC
  Filled 2018-12-21: qty 30

## 2018-12-21 MED ORDER — MEPERIDINE HCL 25 MG/ML IJ SOLN: 6.2500 mg | INTRAMUSCULAR | Status: DC | PRN

## 2018-12-21 MED ORDER — LIDOCAINE HCL (CARDIAC) PF 100 MG/5ML IV SOSY
PREFILLED_SYRINGE | INTRAVENOUS | Status: DC | PRN
  Administered 2018-12-21: 50 mg via INTRAVENOUS

## 2018-12-21 MED ORDER — MIDAZOLAM HCL 2 MG/2ML IJ SOLN
INTRAMUSCULAR | Status: AC
  Filled 2018-12-21: qty 2

## 2018-12-21 MED ORDER — HYDROCODONE-ACETAMINOPHEN 7.5-325 MG PO TABS: 1.0000 | ORAL_TABLET | Freq: Once | ORAL | Status: DC | PRN

## 2018-12-21 MED ORDER — CEFAZOLIN SODIUM-DEXTROSE 2-4 GM/100ML-% IV SOLN
INTRAVENOUS | Status: AC
  Filled 2018-12-21: qty 100

## 2018-12-21 MED ORDER — SCOPOLAMINE 1 MG/3DAYS TD PT72: 1.0000 | MEDICATED_PATCH | Freq: Once | TRANSDERMAL | Status: DC | PRN

## 2018-12-21 MED ORDER — ONDANSETRON HCL 4 MG/2ML IJ SOLN
INTRAMUSCULAR | Status: AC
  Filled 2018-12-21: qty 2

## 2018-12-21 MED ORDER — BACITRACIN ZINC 500 UNIT/GM EX OINT
TOPICAL_OINTMENT | CUTANEOUS | Status: AC
  Filled 2018-12-21: qty 0.9

## 2018-12-21 MED ORDER — PROPOFOL 500 MG/50ML IV EMUL
INTRAVENOUS | Status: AC
  Filled 2018-12-21: qty 50

## 2018-12-21 MED ORDER — PROPOFOL 10 MG/ML IV BOLUS
INTRAVENOUS | Status: DC | PRN
  Administered 2018-12-21: 150 mg via INTRAVENOUS

## 2018-12-21 MED ORDER — FENTANYL CITRATE (PF) 100 MCG/2ML IJ SOLN: 25.0000 ug | INTRAMUSCULAR | Status: DC | PRN

## 2018-12-21 MED ORDER — LACTATED RINGERS IV SOLN
INTRAVENOUS | Status: DC
  Administered 2018-12-21: 07:00:00 via INTRAVENOUS

## 2018-12-21 SURGICAL SUPPLY — 48 items
ADH SKN CLS APL DERMABOND .7 (GAUZE/BANDAGES/DRESSINGS)
APL PRP STRL LF DISP 70% ISPRP (MISCELLANEOUS)
BLADE CLIPPER SURG (BLADE) IMPLANT
BLADE HEX COATED 2.75 (ELECTRODE) IMPLANT
BLADE SURG 15 STRL LF DISP TIS (BLADE) ×1 IMPLANT
BLADE SURG 15 STRL SS (BLADE) ×2
CANISTER SUCT 1200ML W/VALVE (MISCELLANEOUS) IMPLANT
CHLORAPREP W/TINT 26 (MISCELLANEOUS) IMPLANT
COVER BACK TABLE REUSABLE LG (DRAPES) ×2 IMPLANT
COVER MAYO STAND REUSABLE (DRAPES) ×2 IMPLANT
COVER WAND RF STERILE (DRAPES) IMPLANT
DERMABOND ADVANCED (GAUZE/BANDAGES/DRESSINGS)
DERMABOND ADVANCED .7 DNX12 (GAUZE/BANDAGES/DRESSINGS) IMPLANT
DRAPE LAPAROTOMY 100X72 PEDS (DRAPES) IMPLANT
DRAPE LAPAROTOMY T 98X78 PEDS (DRAPES) ×2 IMPLANT
DRAPE U-SHAPE 76X120 STRL (DRAPES) IMPLANT
DRSG MEPILEX BORDER 4X4 (GAUZE/BANDAGES/DRESSINGS) ×2 IMPLANT
ELECT COATED BLADE 2.86 ST (ELECTRODE) ×2 IMPLANT
ELECT REM PT RETURN 9FT ADLT (ELECTROSURGICAL) ×2
ELECTRODE REM PT RTRN 9FT ADLT (ELECTROSURGICAL) ×1 IMPLANT
GAUZE SPONGE 4X4 12PLY STRL LF (GAUZE/BANDAGES/DRESSINGS) IMPLANT
GLOVE BIO SURGEON STRL SZ 6.5 (GLOVE) ×2 IMPLANT
GLOVE BIOGEL M 6.5 STRL (GLOVE) ×4 IMPLANT
GLOVE BIOGEL PI IND STRL 7.0 (GLOVE) ×2 IMPLANT
GLOVE BIOGEL PI INDICATOR 7.0 (GLOVE) ×2
GLOVE ECLIPSE 6.5 STRL STRAW (GLOVE) ×2 IMPLANT
GLOVE SURG SS PI 7.5 STRL IVOR (GLOVE) ×2 IMPLANT
GOWN STRL REUS W/ TWL LRG LVL3 (GOWN DISPOSABLE) ×1 IMPLANT
GOWN STRL REUS W/TWL LRG LVL3 (GOWN DISPOSABLE) ×2
NEEDLE HYPO 25X1 1.5 SAFETY (NEEDLE) IMPLANT
NS IRRIG 1000ML POUR BTL (IV SOLUTION) IMPLANT
PACK BASIN DAY SURGERY FS (CUSTOM PROCEDURE TRAY) ×2 IMPLANT
PENCIL BUTTON HOLSTER BLD 10FT (ELECTRODE) ×2 IMPLANT
SPONGE GAUZE 2X2 8PLY STRL LF (GAUZE/BANDAGES/DRESSINGS) IMPLANT
STRIP CLOSURE SKIN 1/2X4 (GAUZE/BANDAGES/DRESSINGS) ×2 IMPLANT
SUCTION FRAZIER HANDLE 10FR (MISCELLANEOUS)
SUCTION TUBE FRAZIER 10FR DISP (MISCELLANEOUS) IMPLANT
SUT ETHILON 4 0 PS 2 18 (SUTURE) IMPLANT
SUT MNCRL AB 4-0 PS2 18 (SUTURE) ×2 IMPLANT
SUT MON AB 3-0 SH 27 (SUTURE)
SUT MON AB 3-0 SH27 (SUTURE) IMPLANT
SUT MON AB 5-0 PS2 18 (SUTURE) ×2 IMPLANT
SYR BULB 3OZ (MISCELLANEOUS) IMPLANT
SYR CONTROL 10ML LL (SYRINGE) ×2 IMPLANT
TOWEL GREEN STERILE FF (TOWEL DISPOSABLE) ×2 IMPLANT
TRAY DSU PREP LF (CUSTOM PROCEDURE TRAY) IMPLANT
TUBE CONNECTING 20X1/4 (TUBING) ×2 IMPLANT
YANKAUER SUCT BULB TIP NO VENT (SUCTIONS) ×2 IMPLANT

## 2018-12-21 NOTE — Discharge Instructions (Signed)
INSTRUCTIONS FOR AFTER SURGERY   You are having surgery.  You will likely have some questions about what to expect following your operation.  The following information will help you and your family understand what to expect when you are discharged from the hospital.  Following these guidelines will help ensure a smooth recovery and reduce risks of complications.   Postoperative instructions include information on: diet, wound care, medications and physical activity.  AFTER SURGERY Expect to go home after the procedure.   DIET This surgery does not require a specific diet.  However, I have to mention that the healthier you eat the better your body can start healing. It is important to increasing your protein intake.  This means limiting the foods with sugar and carbohydrates.  Focus on vegetables and some meat.  If you have any liposuction during your procedure be sure to drink water.  If your urine is bright yellow, then it is concentrated, and you need to drink more water.  As a general rule after surgery, you should have 8 ounces of water every hour while awake.  If you find you are persistently nauseated or unable to take in liquids let us know.  NO TOBACCO USE or EXPOSURE.  This will slow your healing process and increase the risk of a wound.  WOUND CARE You can shower the day after surgery.  Use fragrance free soap.  Dial, [place] and [place] are usually mild on the skin. If you have steri-strips / tape directly attached to your skin leave them in place. It is OK to get these wet.  No baths, pools or hot tubs for two weeks. We close your incision to leave the smallest and best-looking scar. No ointment or creams on your incisions until given the go ahead.  Especially not Neosporin (Too many skin reactions with this one).  A few weeks after surgery you can use Mederma and start massaging the scar. We ask you to wear a sports bra for a week. This provides added comfort and helps reduce the fluid  accumulation at the surgery site.  ACTIVITY No heavy lifting until cleared by the doctor.  It is OK to walk and climb stairs. In fact, moving your legs is very important to decrease your risk of a blood clot.  It will also help keep you from getting deconditioned.  Every 1 to 2 hours get up and walk for 5 minutes. This will help with a quicker recovery back to normal.  Let pain be your guide so you don't do too much.  NO, you cannot do the spring cleaning and don't plan on taking care of anyone else.  This is your time for TLC.  You will be more comfortable if you sleep and rest with your head elevated either with a few pillows under you or in a recliner.  No stomach sleeping for a few months.  WORK Everyone returns to work at different times. As a rough guide, most people take at least 1 - 2 weeks off prior to returning to work. If you need documentation for your job, bring the forms to your postoperative follow up visit.  DRIVING Arrange for someone to bring you home from the hospital.  You may be able to drive a few days after surgery but not while taking any narcotics or valium.  BOWEL MOVEMENTS Constipation can occur after anesthesia and while taking pain medication.  It is important to stay ahead for your comfort.  We recommend taking Milk  of Magnesia (2 tablespoons; twice a day) while taking the pain pills.  SEROMA This is fluid your body tried to put in the surgical site.  This is normal but if it creates tight skinny skin let us know.  It usually decreases in a few weeks.  WHEN TO CALL Call your surgeon's office if any of the following occur:  Fever 101 degrees F or greater  Excessive bleeding or fluid from the incision site.  Pain that increases over time without aid from the medications  Redness, warmth, or pus draining from incision sites  Persistent nausea or inability to take in liquids  Severe misshapen area that underwent the operation.        Post Anesthesia  Home Care Instructions  Activity: Get plenty of rest for the remainder of the day. A responsible individual must stay with you for 24 hours following the procedure.  For the next 24 hours, DO NOT: -Drive a car -Paediatric nurse -Drink alcoholic beverages -Take any medication unless instructed by your physician -Make any legal decisions or sign important papers.  Meals: Start with liquid foods such as gelatin or soup. Progress to regular foods as tolerated. Avoid greasy, spicy, heavy foods. If nausea and/or vomiting occur, drink only clear liquids until the nausea and/or vomiting subsides. Call your physician if vomiting continues.  Special Instructions/Symptoms: Your throat may feel dry or sore from the anesthesia or the breathing tube placed in your throat during surgery. If this causes discomfort, gargle with warm salt water. The discomfort should disappear within 24 hours.  If you had a scopolamine patch placed behind your ear for the management of post- operative nausea and/or vomiting:  1. The medication in the patch is effective for 72 hours, after which it should be removed.  Wrap patch in a tissue and discard in the trash. Wash hands thoroughly with soap and water. 2. You may remove the patch earlier than 72 hours if you experience unpleasant side effects which may include dry mouth, dizziness or visual disturbances. 3. Avoid touching the patch. Wash your hands with soap and water after contact with the patch.

## 2018-12-21 NOTE — Op Note (Signed)
DATE OF OPERATION: 12/21/2018  LOCATION: [facility]  PREOPERATIVE DIAGNOSIS: left posterior shoulder lipoma  POSTOPERATIVE DIAGNOSIS: Same  PROCEDURE: Excision of left posterior shoulder lipoma 5 x 6 cm  SURGEON:  Sanger , DO  ASSISTANT: Bonita Cox, RNFA  EBL: none  CONDITION: Stable  COMPLICATIONS: None  INDICATION: The patient, Annette Gutierrez, is a 38 y.o. female born on 1980/11/28, is here for treatment of a growing mass, likely a lipoma, on her left posterior shoulder.Marland Kitchen   PROCEDURE DETAILS:  The patient was seen prior to surgery and marked.  The IV antibiotics were given. The patient was taken to the operating room and given a general anesthetic. A standard time out was performed and all information was confirmed by those in the room. SCDs were placed.   The patient was placed in the right lateral position.  She was prepped and draped.  Local with epinepherine was injected at the site for intraoperative hemostasis and post operative pain control.  The #15 blade was used to make an incision over the mass.  The bovie was used to dissect to the fatty layer.  The tissue scissors were used to dissect around the 5 x 6 cm mass and free it from the surrounding tissue.  Hemostasis was achieved with electrocautery.  The entire lesion was removed.  It appeared to be a normal lipoma.  The deep layers were closed with the 4-0 Monocryl.  The skin was closed with the 4-0 Monocryl running subcuticular closure.  Derma bond and steri strips were applied.  The patient was allowed to wake up and taken to recovery room in stable condition at the end of the case. The family was notified at the end of the case.   The RNFA assisted throughout the case.  The RNFA was essential in retraction and counter traction when needed to make the case progress smoothly.  This retraction and assistance made it possible to see the tissue plans for the procedure.  The assistance was needed for  blood control, tissue re-approximation and assisted with closure of the incision site.

## 2018-12-21 NOTE — Anesthesia Procedure Notes (Signed)
Procedure Name: LMA Insertion Date/Time: 12/21/2018 7:28 AM Performed by: Willa Frater, CRNA Pre-anesthesia Checklist: Patient identified, Emergency Drugs available, Suction available and Patient being monitored Patient Re-evaluated:Patient Re-evaluated prior to induction Oxygen Delivery Method: Circle system utilized Preoxygenation: Pre-oxygenation with 100% oxygen Induction Type: IV induction Ventilation: Mask ventilation without difficulty LMA: LMA inserted LMA Size: 4.0 Number of attempts: 1 Airway Equipment and Method: Bite block Placement Confirmation: positive ETCO2 Tube secured with: Tape Dental Injury: Teeth and Oropharynx as per pre-operative assessment

## 2018-12-21 NOTE — Anesthesia Procedure Notes (Signed)
Performed by: Clio Gerhart D, CRNA       

## 2018-12-21 NOTE — Interval H&P Note (Signed)
History and Physical Interval Note:  12/21/2018 6:58 AM  Annette Gutierrez  has presented today for surgery, with the diagnosis of lipoma of torso.  The various methods of treatment have been discussed with the patient and family. After consideration of risks, benefits and other options for treatment, the patient has consented to  Procedure(s): [place] (Left) as a surgical intervention.  The patient's history has been reviewed, patient examined, no change in status, stable for surgery.  I have reviewed the patient's chart and labs.  Questions were answered to the patient's satisfaction.     Loel Lofty Jhovany Weidinger

## 2018-12-21 NOTE — Anesthesia Postprocedure Evaluation (Signed)
Anesthesia Post Note  Patient: Annette Gutierrez  Procedure(s) Performed: EXCISION LIPOMA OF BACK (Left Back)     Patient location during evaluation: PACU Anesthesia Type: General Level of consciousness: awake and alert Pain management: pain level controlled Vital Signs Assessment: post-procedure vital signs reviewed and stable Respiratory status: spontaneous breathing, nonlabored ventilation, respiratory function stable and patient connected to nasal cannula oxygen Cardiovascular status: blood pressure returned to baseline and stable Postop Assessment: no apparent nausea or vomiting Anesthetic complications: no    Last Vitals:  Vitals:   12/21/18 0830 12/21/18 0845  BP: 113/78 107/63  Pulse: 82 81  Resp: 16 18  Temp:  36.7 C  SpO2: 100% 100%    Last Pain:  Vitals:   12/21/18 0845  TempSrc:   PainSc: 0-No pain                 Barnet Glasgow

## 2018-12-21 NOTE — Transfer of Care (Signed)
Immediate Anesthesia Transfer of Care Note  Patient: Annette Gutierrez  Procedure(s) Performed: EXCISION LIPOMA OF BACK (Left Back)  Patient Location: PACU  Anesthesia Type:General  Level of Consciousness: sedated  Airway & Oxygen Therapy: Patient Spontanous Breathing and Patient connected to face mask oxygen  Post-op Assessment: Report given to RN and Post -op Vital signs reviewed and stable  Post vital signs: Reviewed and stable  Last Vitals:  Vitals Value Taken Time  BP 113/71 12/21/2018  8:05 AM  Temp    Pulse 101 12/21/2018  8:06 AM  Resp 14 12/21/2018  8:06 AM  SpO2 100 % 12/21/2018  8:06 AM  Vitals shown include unvalidated device data.  Last Pain:  Vitals:   12/21/18 0648  TempSrc: Oral  PainSc: 0-No pain         Complications: No apparent anesthesia complications

## 2018-12-22 ENCOUNTER — Encounter (HOSPITAL_BASED_OUTPATIENT_CLINIC_OR_DEPARTMENT_OTHER): Payer: Self-pay | Admitting: Plastic Surgery

## 2018-12-25 ENCOUNTER — Telehealth: Payer: Self-pay | Admitting: Plastic Surgery

## 2018-12-25 NOTE — Telephone Encounter (Signed)
Received call from patient regarding concerns for fluid after excision of lipoma on Thursday. She states that the area of the lipoma feels "squishy" as though there is fluid buildup. She would like to know if this needs to be evaluated sooner than her follow up appointment which is scheduled for on 12/29/2018.

## 2018-12-25 NOTE — Telephone Encounter (Addendum)
Received call from patient regarding concerns for fluid after excision of lipoma.

## 2018-12-25 NOTE — Telephone Encounter (Signed)
Called and spoke with the patient and informed her that I spoke with Dr Marla Roe regarding her message.  Dr. Marla Roe stated that what's going on is very common.  She would like for her to use a touch of ice on the area along with pressure,and she would like for her to come in for a office visit tomorrow to see if we need to drain the area.  Patient verbalized understanding and agreed.  Patient was scheduled for (Tues.-12/26/18 @ 12:00pm).//AB/CMA

## 2018-12-26 ENCOUNTER — Ambulatory Visit (INDEPENDENT_AMBULATORY_CARE_PROVIDER_SITE_OTHER): Payer: 59 | Admitting: Plastic Surgery

## 2018-12-26 ENCOUNTER — Encounter: Payer: Self-pay | Admitting: Plastic Surgery

## 2018-12-26 ENCOUNTER — Other Ambulatory Visit: Payer: Self-pay

## 2018-12-26 VITALS — BP 129/81 | HR 63 | Temp 98.6°F | Ht <= 58 in | Wt 128.4 lb

## 2018-12-26 DIAGNOSIS — D171 Benign lipomatous neoplasm of skin and subcutaneous tissue of trunk: Secondary | ICD-10-CM

## 2018-12-26 NOTE — Progress Notes (Signed)
The patient is a 38 year old black female here for follow-up on her lipoma excision.  Path was consistent with a lipoma.  She thought maybe there was a little fluid that was built up. I was able to aspirate .5 cc of serous fluid.  The incision is healing nicely.  There is no sign of infection.  The sutures are in place.  I changed the Steri-Strips.  Recommend follow-up in a week.

## 2018-12-28 MED FILL — DOXYCYCLINE HYCLATE 100 MG: 100 | 30 days supply | Qty: 30 | Fill #1

## 2018-12-29 ENCOUNTER — Encounter: Payer: 59 | Admitting: Plastic Surgery

## 2019-01-05 ENCOUNTER — Ambulatory Visit (INDEPENDENT_AMBULATORY_CARE_PROVIDER_SITE_OTHER): Payer: 59 | Admitting: Plastic Surgery

## 2019-01-05 ENCOUNTER — Encounter: Payer: 59 | Admitting: Plastic Surgery

## 2019-01-05 ENCOUNTER — Other Ambulatory Visit: Payer: Self-pay

## 2019-01-05 ENCOUNTER — Encounter: Payer: Self-pay | Admitting: Plastic Surgery

## 2019-01-05 VITALS — BP 135/84 | HR 72 | Temp 98.3°F | Ht 59.0 in | Wt 125.0 lb

## 2019-01-05 DIAGNOSIS — D171 Benign lipomatous neoplasm of skin and subcutaneous tissue of trunk: Secondary | ICD-10-CM

## 2019-01-05 NOTE — Progress Notes (Signed)
The patient is a 39 year old female here for follow-up after undergoing resection of a lipoma of her left back area.  She is extremely happy.  Today there does not appear to be any fluid retention.  No seroma.  The incision is healing very nicely.  We will take the stitches out today.  Follow-up as needed.  A Steri-Strip will be applied and she can let that fall off or remove it in 2 weeks time.

## 2019-02-08 ENCOUNTER — Other Ambulatory Visit: Payer: Self-pay

## 2019-02-08 ENCOUNTER — Ambulatory Visit (INDEPENDENT_AMBULATORY_CARE_PROVIDER_SITE_OTHER): Payer: 59 | Admitting: Internal Medicine

## 2019-02-08 DIAGNOSIS — L709 Acne, unspecified: Secondary | ICD-10-CM

## 2019-02-08 DIAGNOSIS — N390 Urinary tract infection, site not specified: Secondary | ICD-10-CM

## 2019-02-08 DIAGNOSIS — Z0000 Encounter for general adult medical examination without abnormal findings: Secondary | ICD-10-CM | POA: Insufficient documentation

## 2019-02-08 MED ORDER — CIPROFLOXACIN HCL 500 MG PO TABS: 500.0000 mg | ORAL_TABLET | Freq: Two times a day (BID) | ORAL | 0 refills | Status: DC

## 2019-02-08 NOTE — Progress Notes (Signed)
Virtual Visit via Video Note  I connected with Annette Gutierrez  on 02/08/19 at 11:40 AM EDT by a video enabled telemedicine application and verified that I am speaking with the correct person using two identifiers.  Location patient: work  Environmental manager or home office Persons participating in the virtual visit: patient, provider  I discussed the limitations of evaluation and management by telemedicine and the availability of in person appointments. The patient expressed understanding and agreed to proceed.   HPI: 1. C/u urinary urgency w/o burning h/o UTI and starts with urgency then burning sxs started last night increased water intake  2. Acne doing better on doxycycline bid, clindamyin bid and retina qhs and also using [facility], cerave and happy with the results acne just on right cheek but better and pustules improved and now has residual hyperpigmentation  3. Lipoma to back removed 12/21/2018  4. Annual    ROS: See pertinent positives and negatives per HPI. General: no wt change  HEENT: no sore throat  CV: no chest pain  Lungs: no sob  GI: no ab pain Skin: acne right cheek improved  MSK: no jt pain  Neuro: no h/a  Psych: no anxiety/depression GU: +urinary urgency   Past Medical History:  Diagnosis Date  . Blood in stool   . Lipoma of back   . Vestibular neuritis    2010    Past Surgical History:  Procedure Laterality Date  . LIPOMA EXCISION Left 12/21/2018   Procedure: EXCISION LIPOMA OF BACK;  Surgeon: Wallace Going, DO;  Location: [place];  Service: Plastics;  Laterality: Left;    Family History  Problem Relation Age of Onset  . Diabetes Mother   . Hyperlipidemia Mother   . Hypertension Mother   . Hyperlipidemia Father   . Diabetes Maternal Uncle   . Hyperlipidemia Maternal Grandmother   . Cancer Maternal Grandfather        lung smoker  . Cancer Paternal Grandfather        colon  . Diabetes Maternal Uncle     SOCIAL HX:  married    Current Outpatient Medications:  .  Biotin 5000 MCG TABS, Take by mouth daily., Disp: , Rfl:  .  ciprofloxacin (CIPRO) 500 MG tablet, Take 1 tablet (500 mg total) by mouth 2 (two) times daily. X 3-5 days, Disp: 10 tablet, Rfl: 0 .  clindamycin (CLINDAGEL) 1 % gel, Apply topically 2 (two) times daily., Disp: 60 g, Rfl: 12 .  doxycycline (VIBRA-TABS) 100 MG tablet, Take 1 tablet (100 mg total) by mouth daily., Disp: 30 tablet, Rfl: 2 .  fluconazole (DIFLUCAN) 150 MG tablet, , Disp: , Rfl:  .  levonorgestrel (MIRENA) 20 MCG/24HR IUD, 1 each by Intrauterine route once., Disp: , Rfl:  .  Multiple Vitamin (MULTIVITAMIN) tablet, Take 1 tablet by mouth daily., Disp: , Rfl:  .  tretinoin (RETIN-A) 0.025 % cream, Apply topically at bedtime. qhs, Disp: 45 g, Rfl: 11  EXAM:  VITALS per patient if applicable:  GENERAL: alert, oriented, appears well and in no acute distress  HEENT: atraumatic, conjunttiva clear, no obvious abnormalities on inspection of external nose and ears  NECK: normal movements of the head and neck  LUNGS: on inspection no signs of respiratory distress, breathing rate appears normal, no obvious gross SOB, gasping or wheezing  CV: no obvious cyanosis  MS: moves all visible extremities without noticeable abnormality  PSYCH/NEURO: pleasant and cooperative, no obvious depression or anxiety, speech and  thought processing grossly intact  Skin: acne improved right cheek with hyperpigmentation   ASSESSMENT AND PLAN:  Discussed the following assessment and plan:  Urinary tract infection without hematuria, site unspecified - Plan: Urinalysis, Routine w reflex microscopic, Urine Culture, cipro 500 mg bid x 3-5 days   Acne improved right cheek with hyperpigmentation  Cont meds  rec use ambi right cheek spot treat at night Doxycycline x 3 months then stop with prn Diflucan this is helping    Annual at f/u  Flu shot had 03/2018 at work  Tdap 11/13/16  Pap had  07/07/16 normal [facility] Dr. Landry Mellow -h/o abnormal pap 2006 s/p colp with nl paps since  Colonoscopy 2018-ask at f/u and get if had done  Per pt hep C testing neg 10/2017  rec healthy diet and exercise  Dr. Sheran Fava dental  I discussed the assessment and treatment plan with the patient. The patient was provided an opportunity to ask questions and all were answered. The patient agreed with the plan and demonstrated an understanding of the instructions.   The patient was advised to call back or seek an in-person evaluation if the symptoms worsen or if the condition fails to improve as anticipated.  Time spent 15 minutes  Delorise Jackson, MD

## 2019-02-10 LAB — URINALYSIS, ROUTINE W REFLEX MICROSCOPIC
Bilirubin Urine: NEGATIVE
Glucose, UA: NEGATIVE
Hgb urine dipstick: NEGATIVE
Ketones, ur: NEGATIVE
Leukocytes,Ua: NEGATIVE
Nitrite: NEGATIVE
Protein, ur: NEGATIVE
Specific Gravity, Urine: 1.006 (ref 1.001–1.03)
pH: 6.5 (ref 5.0–8.0)

## 2019-02-10 LAB — URINE CULTURE
MICRO NUMBER:: 697660
Result:: NO GROWTH
SPECIMEN QUALITY:: ADEQUATE

## 2019-02-19 ENCOUNTER — Other Ambulatory Visit: Payer: Self-pay | Admitting: Obstetrics and Gynecology

## 2019-02-19 ENCOUNTER — Other Ambulatory Visit (HOSPITAL_COMMUNITY)
Admission: RE | Admit: 2019-02-19 | Discharge: 2019-02-19 | Disposition: A | Discharge status: Home / Self Care | Payer: 59 | Source: Ambulatory Visit | Attending: Obstetrics and Gynecology | Admitting: Obstetrics and Gynecology

## 2019-02-19 DIAGNOSIS — Z01419 Encounter for gynecological examination (general) (routine) without abnormal findings: Secondary | ICD-10-CM | POA: Insufficient documentation

## 2019-02-19 DIAGNOSIS — Z113 Encounter for screening for infections with a predominantly sexual mode of transmission: Secondary | ICD-10-CM | POA: Diagnosis not present

## 2019-02-19 DIAGNOSIS — N898 Other specified noninflammatory disorders of vagina: Secondary | ICD-10-CM | POA: Diagnosis not present

## 2019-02-20 LAB — CYTOLOGY - PAP
Chlamydia: NEGATIVE
Diagnosis: NEGATIVE
HPV: NOT DETECTED
Neisseria Gonorrhea: NEGATIVE

## 2019-02-25 MED FILL — DOXYCYCLINE HYCLATE 100 MG: 100 | 30 days supply | Qty: 30 | Fill #2

## 2019-04-19 ENCOUNTER — Ambulatory Visit (INDEPENDENT_AMBULATORY_CARE_PROVIDER_SITE_OTHER): Payer: 59 | Admitting: Internal Medicine

## 2019-04-19 ENCOUNTER — Encounter: Payer: Self-pay | Admitting: Internal Medicine

## 2019-04-19 ENCOUNTER — Other Ambulatory Visit: Payer: Self-pay

## 2019-04-19 VITALS — BP 102/60 | HR 71 | Temp 98.1°F | Ht 59.0 in | Wt 129.2 lb

## 2019-04-19 DIAGNOSIS — Z0000 Encounter for general adult medical examination without abnormal findings: Secondary | ICD-10-CM

## 2019-04-19 DIAGNOSIS — E785 Hyperlipidemia, unspecified: Secondary | ICD-10-CM | POA: Insufficient documentation

## 2019-04-19 DIAGNOSIS — Z1389 Encounter for screening for other disorder: Secondary | ICD-10-CM | POA: Diagnosis not present

## 2019-04-19 DIAGNOSIS — Z1329 Encounter for screening for other suspected endocrine disorder: Secondary | ICD-10-CM | POA: Diagnosis not present

## 2019-04-19 HISTORY — DX: Hyperlipidemia, unspecified: E78.5

## 2019-04-19 NOTE — Progress Notes (Signed)
Chief Complaint  Patient presents with  . Annual Exam   Annual  1. Doing well acne improved exercising 1-4 miles intermittently through the week  2. HLD  Review of Systems  Constitutional: Negative for weight loss.  HENT: Negative for hearing loss.   Eyes: Negative for blurred vision.  Respiratory: Negative for shortness of breath.   Cardiovascular: Negative for chest pain.  Gastrointestinal: Negative for abdominal pain.  Musculoskeletal: Negative for falls.  Skin: Negative for rash.  Neurological: Negative for headaches.  Psychiatric/Behavioral: Negative for depression.   Past Medical History:  Diagnosis Date  . Blood in stool   . Lipoma of back   . Vestibular neuritis    2010   Past Surgical History:  Procedure Laterality Date  . LIPOMA EXCISION Left 12/21/2018   Procedure: EXCISION LIPOMA OF BACK;  Surgeon: Wallace Going, DO;  Location: [place];  Service: Plastics;  Laterality: Left;   Family History  Problem Relation Age of Onset  . Diabetes Mother   . Hyperlipidemia Mother   . Hypertension Mother   . Hyperlipidemia Father   . Diabetes Maternal Uncle   . Hyperlipidemia Maternal Grandmother   . Cancer Maternal Grandfather        lung smoker  . Cancer Paternal Grandfather        colon  . Diabetes Maternal Uncle    Social History   Socioeconomic History  . Marital status: Married    Spouse name: Not on file  . Number of children: Not on file  . Years of education: Not on file  . Highest education level: Not on file  Occupational History  . Not on file  Social Needs  . Financial resource strain: Not on file  . Food insecurity    Worry: Not on file    Inability: Not on file  . Transportation needs    Medical: Not on file    Non-medical: Not on file  Tobacco Use  . Smoking status: Never Smoker  . Smokeless tobacco: Never Used  Substance and Sexual Activity  . Alcohol use: Yes    Comment: occasionally  . Drug use: No  . Sexual  activity: Yes    Birth control/protection: I.U.D.  Lifestyle  . Physical activity    Days per week: Not on file    Minutes per session: Not on file  . Stress: Not on file  Relationships  . Social Herbalist on phone: Not on file    Gets together: Not on file    Attends religious service: Not on file    Active member of club or organization: Not on file    Attends meetings of clubs or organizations: Not on file    Relationship status: Not on file  . Intimate partner violence    Fear of current or ex partner: Not on file    Emotionally abused: Not on file    Physically abused: Not on file    Forced sexual activity: Not on file  Other Topics Concern  . 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 Ascorbic Acid (VITAMIN C) 500 MG CAPS   . Biotin 5000 MCG TABS Take by mouth daily.  . clindamycin (CLINDAGEL) 1 % gel Apply topically 2 (two) times daily.  Marland Kitchen  doxycycline (VIBRA-TABS) 100 MG tablet Take 1 tablet (100 mg total) by mouth daily.  . fluconazole (DIFLUCAN) 150 MG tablet   . levonorgestrel (MIRENA) 20 MCG/24HR IUD 1 each by Intrauterine route once.  . Multiple Vitamin (MULTIVITAMIN) tablet Take 1 tablet by mouth daily.  Marland Kitchen tretinoin (RETIN-A) 0.025 % cream Apply topically at bedtime. qhs   No Known Allergies Recent Results (from the past 2160 hour(s))  Urinalysis, Routine w reflex microscopic     Status: None   Collection Time: 02/08/19 12:01 PM  Result Value Ref Range   Color, Urine YELLOW YELLOW   APPearance CLEAR CLEAR   Specific Gravity, Urine 1.006 1.001 - 1.03   pH 6.5 5.0 - 8.0   Glucose, UA NEGATIVE NEGATIVE   Bilirubin Urine NEGATIVE NEGATIVE   Ketones, ur NEGATIVE NEGATIVE   Hgb urine dipstick NEGATIVE NEGATIVE   Protein, ur NEGATIVE NEGATIVE   Nitrite NEGATIVE NEGATIVE   Leukocytes,Ua NEGATIVE NEGATIVE   Urine Culture     Status: None   Collection Time: 02/08/19 12:01 PM   Specimen: Urine  Result Value Ref Range   MICRO NUMBER: [id number]    SPECIMEN QUALITY: Adequate    Sample Source NOT GIVEN    STATUS: FINAL    Result: No Growth   Cytology - PAP     Status: None   Collection Time: 02/19/19 12:00 AM  Result Value Ref Range   Adequacy      Satisfactory for evaluation  endocervical/transformation zone component PRESENT.   Diagnosis      NEGATIVE FOR INTRAEPITHELIAL LESIONS OR MALIGNANCY.   Diagnosis      A LETTER WAS SENT TO THE PATIENT INFORMING HER OF THE ABOVE RESULTS.   Chlamydia Negative     Comment: Normal Reference Range - Negative   Neisseria Gonorrhea Negative     Comment: Normal Reference Range - Negative   HPV NOT Detected     Comment: Normal Reference Range - NOT Detected   Material Submitted CervicoVaginal Pap [ThinPrep Imaged]    Objective  Body mass index is 26.1 kg/m. Wt Readings from Last 3 Encounters:  04/19/19 129 lb 3.2 oz (58.6 kg)  01/05/19 125 lb (56.7 kg)  12/26/18 128 lb 6.4 oz (58.2 kg)   Temp Readings from Last 3 Encounters:  04/19/19 98.1 F (36.7 C) (Oral)  01/05/19 98.3 F (36.8 C) (Oral)  12/26/18 98.6 F (37 C) (Oral)   BP Readings from Last 3 Encounters:  04/19/19 102/60  01/05/19 135/84  12/26/18 129/81   Pulse Readings from Last 3 Encounters:  04/19/19 71  01/05/19 72  12/26/18 63    Physical Exam Vitals signs and nursing note reviewed.  Constitutional:      Appearance: Normal appearance. She is well-developed and well-groomed.     Comments: +mask on   HENT:     Head: Normocephalic and atraumatic.  Eyes:     Conjunctiva/sclera: Conjunctivae normal.  Cardiovascular:     Rate and Rhythm: Normal rate and regular rhythm.     Heart sounds: Normal heart sounds. No murmur.  Pulmonary:     Effort: Pulmonary effort is normal.     Breath sounds: Normal breath sounds.  Abdominal:     Tenderness: There is no abdominal  tenderness.  Skin:    General: Skin is warm and dry.  Neurological:     General: No focal deficit present.     Mental Status: She is alert and oriented to person, place, and time. Mental status is at  baseline.     Gait: Gait normal.  Psychiatric:        Attention and Perception: Attention and perception normal.        Mood and Affect: Mood and affect normal.        Speech: Speech normal.        Behavior: Behavior normal. Behavior is cooperative.        Thought Content: Thought content normal.        Cognition and Memory: Cognition and memory normal.        Judgment: Judgment normal.     Assessment  Plan  Annual physical exam  Flu shot had at work 04/05/19  Tdap 11/13/16  Pap [facility] Dr. Landry Mellow neg no HPV 02/19/2019  -h/o abnormal pap 2006 s/p colp with nl paps since  Colonoscopy 2018-ask at f/u and get if had done  Per pt hep C testing neg 10/2017  rec healthy diet and exercise  Vitamin D3 5000 1x per week   Dr. Sheran Fava dental   Provider: Dr. Olivia Mackie [facility]

## 2019-04-19 NOTE — Patient Instructions (Signed)
Cholesterol Content in Foods Cholesterol is a waxy, fat-like substance that helps to carry fat in the blood. The body needs cholesterol in small amounts, but too much cholesterol can cause damage to the arteries and heart. Most people should eat less than 200 milligrams (mg) of cholesterol a day. Foods with cholesterol  Cholesterol is found in animal-based foods, such as meat, seafood, and dairy. Generally, low-fat dairy and lean meats have less cholesterol than full-fat dairy and fatty meats. The milligrams of cholesterol per serving (mg per serving) of common cholesterol-containing foods are listed below. Meat and other proteins  Egg - one large whole egg has 186 mg.  Veal shank - 4 oz has 141 mg.  Lean ground turkey (93% lean) - 4 oz has 118 mg.  Fat-trimmed lamb loin - 4 oz has 106 mg.  Lean ground beef (90% lean) - 4 oz has 100 mg.  Lobster - 3.5 oz has 90 mg.  Pork loin chops - 4 oz has 86 mg.  Canned salmon - 3.5 oz has 83 mg.  Fat-trimmed beef top loin - 4 oz has 78 mg.  Frankfurter - 1 frank (3.5 oz) has 77 mg.  Crab - 3.5 oz has 71 mg.  Roasted chicken without skin, white meat - 4 oz has 66 mg.  Light bologna - 2 oz has 45 mg.  Deli-cut turkey - 2 oz has 31 mg.  Canned tuna - 3.5 oz has 31 mg.  Bacon - 1 oz has 29 mg.  Oysters and mussels (raw) - 3.5 oz has 25 mg.  Mackerel - 1 oz has 22 mg.  Trout - 1 oz has 20 mg.  Pork sausage - 1 link (1 oz) has 17 mg.  Salmon - 1 oz has 16 mg.  Tilapia - 1 oz has 14 mg. Dairy  Soft-serve ice cream -  cup (4 oz) has 103 mg.  Whole-milk yogurt - 1 cup (8 oz) has 29 mg.  Cheddar cheese - 1 oz has 28 mg.  American cheese - 1 oz has 28 mg.  Whole milk - 1 cup (8 oz) has 23 mg.  2% milk - 1 cup (8 oz) has 18 mg.  Cream cheese - 1 tablespoon (Tbsp) has 15 mg.  Cottage cheese -  cup (4 oz) has 14 mg.  Low-fat (1%) milk - 1 cup (8 oz) has 10 mg.  Sour cream - 1 Tbsp has 8.5 mg.  Low-fat yogurt - 1 cup  (8 oz) has 8 mg.  Nonfat Greek yogurt - 1 cup (8 oz) has 7 mg.  Half-and-half cream - 1 Tbsp has 5 mg. Fats and oils  Cod liver oil - 1 tablespoon (Tbsp) has 82 mg.  Butter - 1 Tbsp has 15 mg.  Lard - 1 Tbsp has 14 mg.  Bacon grease - 1 Tbsp has 14 mg.  Mayonnaise - 1 Tbsp has 5-10 mg.  Margarine - 1 Tbsp has 3-10 mg. Exact amounts of cholesterol in these foods may vary depending on specific ingredients and brands. Foods without cholesterol Most plant-based foods do not have cholesterol unless you combine them with a food that has cholesterol. Foods without cholesterol include:  Grains and cereals.  Vegetables.  Fruits.  Vegetable oils, such as olive, canola, and sunflower oil.  Legumes, such as peas, beans, and lentils.  Nuts and seeds.  Egg whites. Summary  The body needs cholesterol in small amounts, but too much cholesterol can cause damage to the arteries and heart.    Most people should eat less than 200 milligrams (mg) of cholesterol a day. This information is not intended to replace advice given to you by your health care provider. Make sure you discuss any questions you have with your health care provider. Document Released: 03/01/2017 Document Revised: 06/17/2017 Document Reviewed: 03/01/2017 Elsevier Patient Education  [place].  High Cholesterol  High cholesterol is a condition in which the blood has high levels of a white, waxy, fat-like substance (cholesterol). The human body needs small amounts of cholesterol. The liver makes all the cholesterol that the body needs. Extra (excess) cholesterol comes from the food that we eat. Cholesterol is carried from the liver by the blood through the blood vessels. If you have high cholesterol, deposits (plaques) may build up on the walls of your blood vessels (arteries). Plaques make the arteries narrower and stiffer. Cholesterol plaques increase your risk for heart attack and stroke. Work with your health  care provider to keep your cholesterol levels in a healthy range. What increases the risk? This condition is more likely to develop in people who:  Eat foods that are high in animal fat (saturated fat) or cholesterol.  Are overweight.  Are not getting enough exercise.  Have a family history of high cholesterol. What are the signs or symptoms? There are no symptoms of this condition. How is this diagnosed? This condition may be diagnosed from the results of a blood test.  If you are older than age 6, your health care provider may check your cholesterol every 4-6 years.  You may be checked more often if you already have high cholesterol or other risk factors for heart disease. The blood test for cholesterol measures:  "Bad" cholesterol (LDL cholesterol). This is the main type of cholesterol that causes heart disease. The desired level for LDL is less than 100.  "Good" cholesterol (HDL cholesterol). This type helps to protect against heart disease by cleaning the arteries and carrying the LDL away. The desired level for HDL is 60 or higher.  Triglycerides. These are fats that the body can store or burn for energy. The desired number for triglycerides is lower than 150.  Total cholesterol. This is a measure of the total amount of cholesterol in your blood, including LDL cholesterol, HDL cholesterol, and triglycerides. A healthy number is less than 200. How is this treated? This condition is treated with diet changes, lifestyle changes, and medicines. Diet changes  This may include eating more whole grains, fruits, vegetables, nuts, and fish.  This may also include cutting back on red meat and foods that have a lot of added sugar. Lifestyle changes  Changes may include getting at least 40 minutes of aerobic exercise 3 times a week. Aerobic exercises include walking, biking, and swimming. Aerobic exercise along with a healthy diet can help you maintain a healthy weight.  Changes may  also include quitting smoking. Medicines  Medicines are usually given if diet and lifestyle changes have failed to reduce your cholesterol to healthy levels.  Your health care provider may prescribe a statin medicine. Statin medicines have been shown to reduce cholesterol, which can reduce the risk of heart disease. Follow these instructions at home: Eating and drinking If told by your health care provider:  Eat chicken (without skin), fish, veal, shellfish, ground [place] breast, and round or loin cuts of red meat.  Do not eat fried foods or fatty meats, such as hot dogs and salami.  Eat plenty of fruits, such as apples.  Eat plenty of vegetables, such as broccoli, potatoes, and carrots.  Eat beans, peas, and lentils.  Eat grains such as barley, rice, couscous, and bulgur wheat.  Eat pasta without cream sauces.  Use skim or nonfat milk, and eat low-fat or nonfat yogurt and cheeses.  Do not eat or drink whole milk, cream, ice cream, egg yolks, or hard cheeses.  Do not eat stick margarine or tub margarines that contain trans fats (also called partially hydrogenated oils).  Do not eat saturated tropical oils, such as coconut oil and palm oil.  Do not eat cakes, cookies, crackers, or other baked goods that contain trans fats.  General instructions  Exercise as directed by your health care provider. Increase your activity level with activities such as gardening, walking, and taking the stairs.  Take over-the-counter and prescription medicines only as told by your health care provider.  Do not use any products that contain nicotine or tobacco, such as cigarettes and e-cigarettes. If you need help quitting, ask your health care provider.  Keep all follow-up visits as told by your health care provider. This is important. Contact a health care provider if:  You are struggling to maintain a healthy diet or weight.  You need help to start on an exercise program.  You need help  to stop smoking. Get help right away if:  You have chest pain.  You have trouble breathing. This information is not intended to replace advice given to you by your health care provider. Make sure you discuss any questions you have with your health care provider. Document Released: 07/05/2005 Document Revised: 07/08/2017 Document Reviewed: 01/03/2016 Elsevier Patient Education  2020 [employer].

## 2019-06-21 ENCOUNTER — Other Ambulatory Visit: Payer: Self-pay

## 2019-06-21 ENCOUNTER — Ambulatory Visit: Payer: 59 | Admitting: Adult Health

## 2019-06-21 ENCOUNTER — Encounter: Payer: Self-pay | Admitting: Adult Health

## 2019-06-21 VITALS — BP 118/76 | Temp 97.4°F | Wt 131.0 lb

## 2019-06-21 DIAGNOSIS — G44209 Tension-type headache, unspecified, not intractable: Secondary | ICD-10-CM | POA: Diagnosis not present

## 2019-06-21 MED ORDER — KETOROLAC TROMETHAMINE 60 MG/2ML IM SOLN
60.0000 mg | Freq: Once | INTRAMUSCULAR | Status: AC
  Administered 2019-06-21: 60 mg via INTRAMUSCULAR

## 2019-06-21 MED ORDER — METHYLPREDNISOLONE ACETATE 80 MG/ML IJ SUSP
80.0000 mg | Freq: Once | INTRAMUSCULAR | Status: AC
  Administered 2019-06-21: 80 mg via INTRAMUSCULAR

## 2019-06-21 NOTE — Progress Notes (Signed)
Subjective:    Patient ID: Annette Gutierrez, female    DOB: [birth year], 38 y.o.   MRN: [id number]  HPI 38 year old female who  has a past medical history of Blood in stool, Lipoma of back, and Vestibular neuritis.  She is being seen for an acute issue of headache. She has no prior history of headaches. Headache has been daily for the last two weeks, some days worse then others. Headache is not constant throughout the day. Headache does not come at the same time every day. Lasts for hours at a time. Pain is described as an " aching dull". Located in middle of forehead and along left temple. Light and sound do not make headache worse. No tearing eyes or blurred vision.   She has tried using various OTC headache medications without resolution    Review of Systems See HPI   Past Medical History:  Diagnosis Date  . Blood in stool   . Lipoma of back   . Vestibular neuritis    2010    Social History   Socioeconomic History  . Marital status: Married    Spouse name: Not on file  . Number of children: Not on file  . Years of education: Not on file  . Highest education level: Not on file  Occupational History  . Not on file  Social Needs  . Financial resource strain: Not on file  . Food insecurity    Worry: Not on file    Inability: Not on file  . Transportation needs    Medical: Not on file    Non-medical: Not on file  Tobacco Use  . Smoking status: Never Smoker  . Smokeless tobacco: Never Used  Substance and Sexual Activity  . Alcohol use: Yes    Comment: occasionally  . Drug use: No  . Sexual activity: Yes    Birth control/protection: I.U.D.  Lifestyle  . Physical activity    Days per week: Not on file    Minutes per session: Not on file  . Stress: Not on file  Relationships  . Social Herbalist on phone: Not on file    Gets together: Not on file    Attends religious service: Not on file    Active member of club or organization: Not on file    Attends  meetings of clubs or organizations: Not on file    Relationship status: Not on file  . Intimate partner violence    Fear of current or ex partner: Not on file    Emotionally abused: Not on file    Physically abused: Not on file    Forced sexual activity: Not on file  Other Topics Concern  . 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 LIPOMA EXCISION Left 12/21/2018   Procedure: EXCISION LIPOMA OF BACK;  Surgeon: Wallace Going, DO;  Location: [place];  Service: Plastics;  Laterality: Left;    Family History  Problem Relation Age of Onset  . Diabetes Mother   . Hyperlipidemia Mother   . Hypertension Mother   . Hyperlipidemia Father   . Diabetes Maternal Uncle   . Hyperlipidemia Maternal Grandmother   . Cancer Maternal Grandfather  lung smoker  . Cancer Paternal Grandfather        colon  . Diabetes Maternal Uncle     No Known Allergies  Current Outpatient Medications on File Prior to Visit  Medication Sig Dispense Refill  . Ascorbic Acid (VITAMIN C) 500 MG CAPS     . Biotin 5000 MCG TABS Take by mouth daily.    . clindamycin (CLINDAGEL) 1 % gel Apply topically 2 (two) times daily.    . fluconazole (DIFLUCAN) 150 MG tablet     . levonorgestrel (MIRENA) 20 MCG/24HR IUD 1 each by Intrauterine route once.    . Multiple Vitamin (MULTIVITAMIN) tablet Take 1 tablet by mouth daily.    Marland Kitchen tretinoin (RETIN-A) 0.025 % cream Apply topically at bedtime. qhs 45 g 11   No current facility-administered medications on file prior to visit.     BP 118/76   Temp (!) 97.4 F (36.3 C) (Temporal)   Wt 131 lb (59.4 kg)   LMP  (LMP Unknown)   BMI 26.46 kg/m       Objective:   Physical Exam Vitals signs and nursing note reviewed.  Constitutional:      Appearance: She is well-developed.   HENT:     Head: Normocephalic and atraumatic.  Eyes:     Extraocular Movements: Extraocular movements intact.     Pupils: Pupils are equal, round, and reactive to light.  Neck:     Musculoskeletal: Normal range of motion and neck supple. No neck rigidity.     Comments: Muscle tightness in trapezius  Cardiovascular:     Rate and Rhythm: Normal rate and regular rhythm.     Heart sounds: Normal heart sounds.  Pulmonary:     Effort: Pulmonary effort is normal.     Breath sounds: Normal breath sounds.  Skin:    General: Skin is warm and dry.  Neurological:     Mental Status: She is alert.  Psychiatric:        Mood and Affect: Mood normal.        Speech: Speech normal.        Behavior: Behavior normal.       Assessment & Plan:  1. Tension headache - ketorolac (TORADOL) injection 60 mg - methylPREDNISolone acetate (DEPO-MEDROL) injection 80 mg  Dorothyann Peng, NP

## 2019-06-22 ENCOUNTER — Other Ambulatory Visit: Payer: Self-pay | Admitting: Adult Health

## 2019-06-22 MED ORDER — BUTALBITAL-APAP-CAFFEINE 50-325-40 MG PO TABS: 1.0000 | ORAL_TABLET | Freq: Four times a day (QID) | ORAL | 0 refills | Status: DC | PRN

## 2019-07-25 ENCOUNTER — Ambulatory Visit: Payer: 59 | Attending: Internal Medicine

## 2019-07-25 DIAGNOSIS — Z20822 Contact with and (suspected) exposure to covid-19: Secondary | ICD-10-CM

## 2019-07-26 LAB — NOVEL CORONAVIRUS, NAA: SARS-CoV-2, NAA: NOT DETECTED

## 2019-08-20 ENCOUNTER — Other Ambulatory Visit: Payer: Self-pay | Admitting: Internal Medicine

## 2019-08-20 ENCOUNTER — Encounter: Payer: Self-pay | Admitting: Internal Medicine

## 2019-08-20 DIAGNOSIS — L700 Acne vulgaris: Secondary | ICD-10-CM

## 2019-08-20 MED ORDER — DOXYCYCLINE HYCLATE 100 MG PO TABS: 100.0000 mg | ORAL_TABLET | Freq: Every day | ORAL | 2 refills | Status: DC

## 2019-08-20 MED FILL — DOXYCYCLINE HYCLATE 100 MG: 100 | 30 days supply | Qty: 30 | Fill #0

## 2019-08-21 DIAGNOSIS — Z30433 Encounter for removal and reinsertion of intrauterine contraceptive device: Secondary | ICD-10-CM | POA: Diagnosis not present

## 2019-08-21 DIAGNOSIS — Z3202 Encounter for pregnancy test, result negative: Secondary | ICD-10-CM | POA: Diagnosis not present

## 2019-08-30 ENCOUNTER — Other Ambulatory Visit: Payer: Self-pay

## 2019-08-31 ENCOUNTER — Other Ambulatory Visit: Payer: 59

## 2019-08-31 DIAGNOSIS — Z1329 Encounter for screening for other suspected endocrine disorder: Secondary | ICD-10-CM

## 2019-08-31 DIAGNOSIS — E785 Hyperlipidemia, unspecified: Secondary | ICD-10-CM

## 2019-08-31 DIAGNOSIS — Z1389 Encounter for screening for other disorder: Secondary | ICD-10-CM

## 2019-08-31 DIAGNOSIS — Z0000 Encounter for general adult medical examination without abnormal findings: Secondary | ICD-10-CM

## 2019-08-31 LAB — CBC WITH DIFFERENTIAL/PLATELET
Basophils Absolute: 0.1 10*3/uL (ref 0.0–0.1)
Basophils Relative: 0.8 % (ref 0.0–3.0)
Eosinophils Absolute: 0.1 10*3/uL (ref 0.0–0.7)
Eosinophils Relative: 0.8 % (ref 0.0–5.0)
HCT: 41 % (ref 36.0–46.0)
Hemoglobin: 13.8 g/dL (ref 12.0–15.0)
Lymphocytes Relative: 28.9 % (ref 12.0–46.0)
Lymphs Abs: 1.9 10*3/uL (ref 0.7–4.0)
MCHC: 33.7 g/dL (ref 30.0–36.0)
MCV: 86.6 fl (ref 78.0–100.0)
Monocytes Absolute: 0.5 10*3/uL (ref 0.1–1.0)
Monocytes Relative: 7 % (ref 3.0–12.0)
Neutro Abs: 4.1 10*3/uL (ref 1.4–7.7)
Neutrophils Relative %: 62.5 % (ref 43.0–77.0)
Platelets: 300 10*3/uL (ref 150.0–400.0)
RBC: 4.73 Mil/uL (ref 3.87–5.11)
RDW: 14 % (ref 11.5–15.5)
WBC: 6.6 10*3/uL (ref 4.0–10.5)

## 2019-08-31 LAB — COMPREHENSIVE METABOLIC PANEL
ALT: 10 U/L (ref 0–35)
AST: 14 U/L (ref 0–37)
Albumin: 4.3 g/dL (ref 3.5–5.2)
Alkaline Phosphatase: 56 U/L (ref 39–117)
BUN: 10 mg/dL (ref 6–23)
CO2: 27 mEq/L (ref 19–32)
Calcium: 9.8 mg/dL (ref 8.4–10.5)
Chloride: 106 mEq/L (ref 96–112)
Creatinine, Ser: 0.88 mg/dL (ref 0.40–1.20)
GFR: 87.71 mL/min (ref >60.00)
Glucose, Bld: 87 mg/dL (ref 70–99)
Potassium: 4.2 mEq/L (ref 3.5–5.1)
Sodium: 138 mEq/L (ref 135–145)
Total Bilirubin: 0.4 mg/dL (ref 0.2–1.2)
Total Protein: 7.1 g/dL (ref 6.0–8.3)

## 2019-08-31 LAB — LIPID PANEL
Cholesterol: 211 mg/dL — ABNORMAL HIGH (ref 0–200)
HDL: 58.2 mg/dL (ref >39.00)
LDL Cholesterol: 142 mg/dL — ABNORMAL HIGH (ref 0–99)
NonHDL: 152.55
Total CHOL/HDL Ratio: 4
Triglycerides: 53 mg/dL (ref 0.0–149.0)
VLDL: 10.6 mg/dL (ref 0.0–40.0)

## 2019-08-31 LAB — TIQ-MISC

## 2019-08-31 LAB — TSH: TSH: 1.79 u[IU]/mL (ref 0.35–4.50)

## 2019-10-01 DIAGNOSIS — Z30431 Encounter for routine checking of intrauterine contraceptive device: Secondary | ICD-10-CM | POA: Diagnosis not present

## 2019-10-23 ENCOUNTER — Telehealth (INDEPENDENT_AMBULATORY_CARE_PROVIDER_SITE_OTHER): Payer: 59 | Admitting: Internal Medicine

## 2019-10-23 ENCOUNTER — Other Ambulatory Visit: Payer: Self-pay | Admitting: Internal Medicine

## 2019-10-23 ENCOUNTER — Encounter: Payer: Self-pay | Admitting: Internal Medicine

## 2019-10-23 VITALS — Ht 59.0 in | Wt 124.0 lb

## 2019-10-23 DIAGNOSIS — L700 Acne vulgaris: Secondary | ICD-10-CM | POA: Diagnosis not present

## 2019-10-23 MED ORDER — SPIRONOLACTONE 25 MG PO TABS: 25.0000 mg | ORAL_TABLET | Freq: Every day | ORAL | 11 refills | Status: DC

## 2019-10-23 MED FILL — TRETINOIN 0.025% CREAM: 0.025 | 30 days supply | Qty: 45 | Fill #1

## 2019-10-23 MED FILL — SPIRONOLACTONE 25 MG TABS: 25 | 30 days supply | Qty: 60 | Fill #0

## 2019-10-23 MED FILL — CLINDAMYCIN PHOSPHATE 1 % G: 1 | 30 days supply | Qty: 60 | Fill #1

## 2019-10-23 NOTE — Progress Notes (Signed)
Virtual Visit via Video Note  I connected with Annette Gutierrez via [place]  on 10/23/19 at 10:25 AM EDT by a video enabled telemedicine application and verified that I am speaking with the correct person using two identifiers.  Location patient: work Environmental manager or home office Persons participating in the virtual visit: patient, provider  I discussed the limitations of evaluation and management by telemedicine and the availability of in person appointments. The patient expressed understanding and agreed to proceed.   HPI: Acne vulgaris worse R>L cheek with cystic acne tried multiple rounds of doxycyline, clindagel, retin A, benzoyl peroxide but not helping. She does have IUD    ROS: See pertinent positives and negatives per HPI.  Past Medical History:  Diagnosis Date  . Blood in stool   . Lipoma of back   . Vestibular neuritis    2010    Past Surgical History:  Procedure Laterality Date  . LIPOMA EXCISION Left 12/21/2018   Procedure: EXCISION LIPOMA OF BACK;  Surgeon: Wallace Going, DO;  Location: [place];  Service: Plastics;  Laterality: Left;    Family History  Problem Relation Age of Onset  . Diabetes Mother   . Hyperlipidemia Mother   . Hypertension Mother   . Hyperlipidemia Father   . Diabetes Maternal Uncle   . Hyperlipidemia Maternal Grandmother   . Cancer Maternal Grandfather        lung smoker  . Cancer Paternal Grandfather        colon  . Diabetes Maternal Uncle     SOCIAL HX: married   Current Outpatient Medications:  .  butalbital-acetaminophen-caffeine (FIORICET) 50-325-40 MG tablet, Take 1-2 tablets by mouth every 6 (six) hours as needed for headache., Disp: 20 tablet, Rfl: 0 .  clindamycin (CLINDAGEL) 1 % gel, Apply topically 2 (two) times daily., Disp: , Rfl:  .  levonorgestrel (MIRENA) 20 MCG/24HR IUD, 1 each by Intrauterine route once., Disp: , Rfl:  .  Multiple Vitamin (MULTIVITAMIN) tablet, Take 1 tablet by mouth  daily., Disp: , Rfl:  .  tretinoin (RETIN-A) 0.025 % cream, Apply topically at bedtime. qhs, Disp: 45 g, Rfl: 11 .  Ascorbic Acid (VITAMIN C) 500 MG CAPS, , Disp: , Rfl:  .  Biotin 5000 MCG TABS, Take by mouth daily., Disp: , Rfl:  .  doxycycline (VIBRA-TABS) 100 MG tablet, Take 1 tablet (100 mg total) by mouth daily. With food, Disp: 30 tablet, Rfl: 2 .  fluconazole (DIFLUCAN) 150 MG tablet, , Disp: , Rfl:  .  spironolactone (ALDACTONE) 25 MG tablet, Take 1-2 tablets (25-50 mg total) by mouth daily. In am. 25 in the am daily x 2 weeks can increase to 50 mg if needed daily in am, Disp: 60 tablet, Rfl: 11  EXAM:  VITALS per patient if applicable:  GENERAL: alert, oriented, appears well and in no acute distress  HEENT: atraumatic, conjunttiva clear, no obvious abnormalities on inspection of external nose and ears  NECK: normal movements of the head and neck  LUNGS: on inspection no signs of respiratory distress, breathing rate appears normal, no obvious gross SOB, gasping or wheezing  CV: no obvious cyanosis  MS: moves all visible extremities without noticeable abnormality  PSYCH/NEURO: pleasant and cooperative, no obvious depression or anxiety, speech and thought processing grossly intact  Skin: aV worse to right>left cheek  ASSESSMENT AND PLAN:  Discussed the following assessment and plan:  Acne vulgaris - Plan: spironolactone (ALDACTONE) 25-50 MG tablet, Ambulatory referral to Dermatology  Cont other meds   -we discussed possible serious and likely etiologies, options for evaluation and workup, limitations of telemedicine visit vs in person visit, treatment, treatment risks and precautions. Pt prefers to treat via telemedicine empirically rather then risking or undertaking an in person visit at this moment. Patient agrees to seek prompt in person care if worsening, new symptoms arise, or if is not improving with treatment.   I discussed the assessment and treatment plan with  the patient. The patient was provided an opportunity to ask questions and all were answered. The patient agreed with the plan and demonstrated an understanding of the instructions.   The patient was advised to call back or seek an in-person evaluation if the symptoms worsen or if the condition fails to improve as anticipated.  Time 20 min Delorise Jackson, MD

## 2019-11-06 ENCOUNTER — Encounter: Payer: Self-pay | Admitting: Internal Medicine

## 2019-11-06 NOTE — Telephone Encounter (Signed)
Okay for new referral

## 2019-11-07 ENCOUNTER — Telehealth: Payer: Self-pay | Admitting: Internal Medicine

## 2019-11-07 NOTE — Telephone Encounter (Signed)
Derm referral send to Dr. Lanier Prude [facility] [phone] fax [phone]

## 2019-11-08 NOTE — Telephone Encounter (Signed)
Referral was sent to [facility] for Referral: Dr. Lois Huxley [facility] in [place] or [place] office acne I will re send to Dr Lanier Prude as of 11/08/2019.

## 2019-12-24 DIAGNOSIS — F4323 Adjustment disorder with mixed anxiety and depressed mood: Secondary | ICD-10-CM | POA: Diagnosis not present

## 2020-01-03 ENCOUNTER — Ambulatory Visit (INDEPENDENT_AMBULATORY_CARE_PROVIDER_SITE_OTHER): Payer: 59

## 2020-01-03 ENCOUNTER — Ambulatory Visit: Payer: 59 | Admitting: Internal Medicine

## 2020-01-03 ENCOUNTER — Encounter: Payer: Self-pay | Admitting: Internal Medicine

## 2020-01-03 ENCOUNTER — Other Ambulatory Visit: Payer: Self-pay

## 2020-01-03 VITALS — BP 118/84 | HR 77 | Temp 97.9°F | Ht 59.0 in | Wt 118.2 lb

## 2020-01-03 DIAGNOSIS — R0602 Shortness of breath: Secondary | ICD-10-CM | POA: Diagnosis not present

## 2020-01-03 DIAGNOSIS — J984 Other disorders of lung: Secondary | ICD-10-CM | POA: Diagnosis not present

## 2020-01-03 DIAGNOSIS — R002 Palpitations: Secondary | ICD-10-CM | POA: Diagnosis not present

## 2020-01-03 DIAGNOSIS — L700 Acne vulgaris: Secondary | ICD-10-CM

## 2020-01-03 LAB — COMPREHENSIVE METABOLIC PANEL
ALT: 12 U/L (ref 0–35)
AST: 15 U/L (ref 0–37)
Albumin: 4.5 g/dL (ref 3.5–5.2)
Alkaline Phosphatase: 53 U/L (ref 39–117)
BUN: 11 mg/dL (ref 6–23)
CO2: 28 mEq/L (ref 19–32)
Calcium: 9.8 mg/dL (ref 8.4–10.5)
Chloride: 102 mEq/L (ref 96–112)
Creatinine, Ser: 1.07 mg/dL (ref 0.40–1.20)
GFR: 69.87 mL/min (ref >60.00)
Glucose, Bld: 119 mg/dL — ABNORMAL HIGH (ref 70–99)
Potassium: 4 mEq/L (ref 3.5–5.1)
Sodium: 135 mEq/L (ref 135–145)
Total Bilirubin: 0.3 mg/dL (ref 0.2–1.2)
Total Protein: 7.6 g/dL (ref 6.0–8.3)

## 2020-01-03 LAB — TROPONIN I (HIGH SENSITIVITY): High Sens Troponin I: 3 ng/L (ref 2–17)

## 2020-01-03 LAB — D-DIMER, QUANTITATIVE: D-Dimer, Quant: 0.2 mcg/mL FEU (ref <0.50)

## 2020-01-03 LAB — PRO B NATRIURETIC PEPTIDE: NT-Pro BNP: 16 pg/mL (ref 0–130)

## 2020-01-03 MED ORDER — SPIRONOLACTONE 50 MG PO TABS: 50.0000 mg | ORAL_TABLET | Freq: Every day | ORAL | Status: DC

## 2020-01-03 NOTE — Progress Notes (Signed)
Chief Complaint  Patient presents with  . Shortness of Breath   F/u  1. Palpitations and sob new x 2 weeks. She had sob getting dressed 2 seeks ago new IUD 08/2019 and this is 4th IUD   Review of Systems  Constitutional: Negative for weight loss.  HENT: Negative for hearing loss.   Eyes: Negative for blurred vision.  Respiratory: Positive for shortness of breath.   Cardiovascular: Positive for palpitations. Negative for chest pain.  Skin: Negative for rash.   Past Medical History:  Diagnosis Date  . Blood in stool   . Lipoma of back   . Vestibular neuritis    2010   Past Surgical History:  Procedure Laterality Date  . LIPOMA EXCISION Left 12/21/2018   Procedure: EXCISION LIPOMA OF BACK;  Surgeon: Wallace Going, DO;  Location: [place];  Service: Plastics;  Laterality: Left;   Family History  Problem Relation Age of Onset  . Diabetes Mother   . Hyperlipidemia Mother   . Hypertension Mother   . Hyperlipidemia Father   . Diabetes Maternal Uncle   . Hyperlipidemia Maternal Grandmother   . Cancer Maternal Grandfather        lung smoker  . Cancer Paternal Grandfather        colon  . Diabetes Maternal Uncle    Social History   Socioeconomic History  . Marital status: Unknown    Spouse name: Not on file  . Number of children: Not on file  . Years of education: Not on file  . Highest education level: Not on file  Occupational History  . Not on file  Tobacco Use  . Smoking status: Never Smoker  . Smokeless tobacco: Never Used  Substance and Sexual Activity  . Alcohol use: Yes    Comment: occasionally  . Drug use: No  . Sexual activity: Yes    Birth control/protection: I.U.D.  Other Topics Concern  . 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 Difficulty of Paying  Living Expenses:   Food Insecurity:   . Worried About Charity fundraiser in the Last Year:   . Arboriculturist in the Last Year:   Transportation Needs:   . Film/video editor (Medical):   Marland Kitchen Lack of Transportation (Non-Medical):   Physical Activity:   . Days of Exercise per Week:   . Minutes of Exercise per Session:   Stress:   . Feeling of Stress :   Social Connections:   . Frequency of Communication with Friends and Family:   . Frequency of Social Gatherings with Friends and Family:   . Attends Religious Services:   . Active Member of Clubs or Organizations:   . Attends Archivist Meetings:   Marland Kitchen Marital Status:   Intimate Partner Violence:   . Fear of Current or Ex-Partner:   . Emotionally Abused:   Marland Kitchen Physically Abused:   . Sexually Abused:    Current Meds  Medication Sig  . clindamycin (CLINDAGEL) 1 % gel Apply topically 2 (two) times daily.  . Cyanocobalamin (VITAMIN B-12 PO) Take 3,000 mcg by mouth daily.  Marland Kitchen levonorgestrel (MIRENA) 20 MCG/24HR IUD 1 each by Intrauterine route once.  . Multiple Vitamin (MULTIVITAMIN) tablet Take 1 tablet  by mouth daily.  . NON FORMULARY cyspera bleaching cream qhs  . spironolactone (ALDACTONE) 50 MG tablet Take 1 tablet (50 mg total) by mouth daily.  Marland Kitchen tretinoin (RETIN-A) 0.025 % cream Apply topically at bedtime. qhs  . [DISCONTINUED] spironolactone (ALDACTONE) 25 MG tablet Take 1-2 tablets (25-50 mg total) by mouth daily. In am. 25 in the am daily x 2 weeks can increase to 50 mg if needed daily in am   No Known Allergies No results found for this or any previous visit (from the past 2160 hour(s)). Objective  Body mass index is 23.87 kg/m. Wt Readings from Last 3 Encounters:  01/03/20 118 lb 3.2 oz (53.6 kg)  10/23/19 124 lb (56.2 kg)  06/21/19 131 lb (59.4 kg)   Temp Readings from Last 3 Encounters:  01/03/20 97.9 F (36.6 C) (Temporal)  06/21/19 (!) 97.4 F (36.3 C) (Temporal)  04/19/19 98.1 F (36.7 C) (Oral)    BP Readings from Last 3 Encounters:  01/03/20 118/84  06/21/19 118/76  04/19/19 102/60   Pulse Readings from Last 3 Encounters:  01/03/20 77  04/19/19 71  01/05/19 72    Physical Exam Vitals and nursing note reviewed.  Constitutional:      Appearance: Normal appearance. She is well-developed and well-groomed.  HENT:     Head: Normocephalic and atraumatic.  Eyes:     Conjunctiva/sclera: Conjunctivae normal.     Pupils: Pupils are equal, round, and reactive to light.  Cardiovascular:     Rate and Rhythm: Normal rate and regular rhythm.     Heart sounds: Normal heart sounds.  Pulmonary:     Effort: Pulmonary effort is normal.     Breath sounds: Normal breath sounds.  Skin:    General: Skin is warm and moist.  Neurological:     General: No focal deficit present.     Mental Status: She is alert and oriented to person, place, and time.     Gait: Gait normal.  Psychiatric:        Attention and Perception: Attention and perception normal.        Mood and Affect: Mood and affect normal.        Speech: Speech normal.        Behavior: Behavior normal. Behavior is cooperative.        Thought Content: Thought content normal.        Cognition and Memory: Cognition and memory normal.        Judgment: Judgment normal.     Assessment  Plan  SOB (shortness of breath) on exertion with palpitations ? Etiology max HR 110 no h/o asthma/allergies, new IUD 08/2019 this is 4th one r/o DVT/PT- Plan: DG Chest 2 View, D-Dimer, Quantitative, Comprehensive metabolic panel, Pro b natriuretic peptide, Troponin I (High Sensitivity) Check K on spironolactone  Consider cone cards in [place] vs CTA w/u PE in future pending labs   HM Flu shot had at work 04/05/19  Tdap 11/13/16  covid 2/2  Pap [facility] Dr. Landry Mellow neg no HPV 02/19/2019  -h/o abnormal pap 2006 s/p colp with nl paps since  New IUD2/2021 Colonoscopy 2018-ask at f/u and get if had done  Per pt hep C testing neg 10/2017  rec healthy  diet and exercise Vitamin D3 5000 1x per week   Dr. Sheran Fava dental  Provider: Dr. Olivia Mackie McLean-Scocuzza-Internal Medicine

## 2020-01-03 NOTE — Progress Notes (Signed)
Patient has been short of breath. Has not history of allergies, asthma, or any current anxiety.

## 2020-01-03 NOTE — Patient Instructions (Signed)
Shortness of Breath, Adult Shortness of breath is when a person has trouble breathing enough air or when a person feels like she or he is having trouble breathing in enough air. Shortness of breath could be a sign of a medical problem. Follow these instructions at home:   Pay attention to any changes in your symptoms.  Do not use any products that contain nicotine or tobacco, such as cigarettes, e-cigarettes, and chewing tobacco.  Do not smoke. Smoking is a common cause of shortness of breath. If you need help quitting, ask your health care provider.  Avoid things that can irritate your airways, such as: ? Mold. ? Dust. ? Air pollution. ? Chemical fumes. ? Things that can cause allergy symptoms (allergens), if you have allergies.  Keep your living space clean and free of mold and dust.  Rest as needed. Slowly return to your usual activities.  Take over-the-counter and prescription medicines only as told by your health care provider. This includes oxygen therapy and inhaled medicines.  Keep all follow-up visits as told by your health care provider. This is important. Contact a health care provider if:  Your condition does not improve as soon as expected.  You have a hard time doing your normal activities, even after you rest.  You have new symptoms. Get help right away if:  Your shortness of breath gets worse.  You have shortness of breath when you are resting.  You feel light-headed or you faint.  You have a cough that is not controlled with medicines.  You cough up blood.  You have pain with breathing.  You have pain in your chest, arms, shoulders, or abdomen.  You have a fever.  You cannot walk up stairs or exercise the way that you normally do. These symptoms may represent a serious problem that is an emergency. Do not wait to see if the symptoms will go away. Get medical help right away. Call your local emergency services (911 in the [place] Do not drive yourself  to the hospital. Summary  Shortness of breath is when a person has trouble breathing enough air. It can be a sign of a medical problem.  Avoid things that irritate your lungs, such as smoking, pollution, mold, and dust.  Pay attention to changes in your symptoms and contact your health care provider if you have a hard time completing daily activities because of shortness of breath. This information is not intended to replace advice given to you by your health care provider. Make sure you discuss any questions you have with your health care provider. Document Revised: 12/05/2017 Document Reviewed: 12/05/2017 Elsevier Patient Education  2020 Elsevier Inc.  

## 2020-01-03 NOTE — Addendum Note (Signed)
Addended by: Orland Mustard on: 01/03/2020 05:15 PM   Modules accepted: Orders

## 2020-01-07 NOTE — Addendum Note (Signed)
Addended by: Orland Mustard on: 01/07/2020 07:51 AM   Modules accepted: Orders

## 2020-01-08 DIAGNOSIS — F4323 Adjustment disorder with mixed anxiety and depressed mood: Secondary | ICD-10-CM | POA: Diagnosis not present

## 2020-01-09 ENCOUNTER — Ambulatory Visit: Payer: 59 | Admitting: Cardiology

## 2020-01-09 ENCOUNTER — Telehealth: Payer: Self-pay

## 2020-01-09 ENCOUNTER — Other Ambulatory Visit: Payer: Self-pay

## 2020-01-09 ENCOUNTER — Encounter: Payer: Self-pay | Admitting: Cardiology

## 2020-01-09 VITALS — BP 116/76 | HR 74 | Ht 59.0 in | Wt 117.4 lb

## 2020-01-09 DIAGNOSIS — R0602 Shortness of breath: Secondary | ICD-10-CM | POA: Diagnosis not present

## 2020-01-09 DIAGNOSIS — R002 Palpitations: Secondary | ICD-10-CM

## 2020-01-09 DIAGNOSIS — E7800 Pure hypercholesterolemia, unspecified: Secondary | ICD-10-CM

## 2020-01-09 NOTE — Progress Notes (Signed)
Cardiology Office Note:    Date:  01/10/2020   ID:  Annette Gutierrez, DOB 02/01/1981, MRN [id number]  PCP:  McLean-Scocuzza, Nino Glow, MD  [facility] Cardiologist:  Ena Dawley, MD  [place] Electrophysiologist:  None   Referring MD: McLean-Scocuzza, Olivia Mackie *   Chief complaint: Shortness of breath, palpitations  History of Present Illness:    Annette Gutierrez is a 39 y.o. female with no significant past medical history, no cardiac history and no family history of premature coronary artery disease or sudden cardiac death. The patient is premenopausal, has 1 healthy child 2 year old, she had uncomplicated pregnancy with no gestational diabetes or hypertension.  The patient started to develop symptoms of palpitations associated with shortness of breath about 3 weeks ago, they are not related to exertion, usually happen at rest, they start all sudden abruptly and slowly resolved within minutes.  She underwent chest x-ray that was completely normal, she got the whole panel of labs that showed normal electrolytes liver and kidney function, normal CBC, normal thyroid function, normal D-dimer. She denies any fever chills or any other unusual condition prior experiencing these symptoms.  These palpitation are not associated with presyncopal or syncopal episodes.  She has no lower extremity edema orthopnea proximal nocturnal dyspnea.  Past Medical History:  Diagnosis Date  . Acne vulgaris 11/21/2018  . Blood in stool   . Hyperlipidemia 04/19/2019  . Lipoma of back   . Vestibular neuritis    2010   Past Surgical History:  Procedure Laterality Date  . LIPOMA EXCISION Left 12/21/2018   Procedure: EXCISION LIPOMA OF BACK;  Surgeon: Wallace Going, DO;  Location: [place];  Service: Plastics;  Laterality: Left;    Current Medications: Current Meds  Medication Sig  . butalbital-acetaminophen-caffeine (FIORICET) 50-325-40 MG tablet Take 1-2 tablets by mouth every 6 (six)  hours as needed for headache.  . clindamycin (CLINDAGEL) 1 % gel Apply topically 2 (two) times daily.  . Cyanocobalamin (VITAMIN B-12 PO) Take 3,000 mcg by mouth daily.  Marland Kitchen levonorgestrel (MIRENA) 20 MCG/24HR IUD 1 each by Intrauterine route once.  . Multiple Vitamin (MULTIVITAMIN) tablet Take 1 tablet by mouth daily.  . NON FORMULARY cyspera bleaching cream qhs  . spironolactone (ALDACTONE) 50 MG tablet Take 1 tablet (50 mg total) by mouth daily.  Marland Kitchen tretinoin (RETIN-A) 0.025 % cream Apply topically at bedtime. qhs  . valACYclovir (VALTREX) 500 MG tablet Take 500 mg by mouth daily as needed.     Allergies:   Patient has no known allergies.   Social History   Socioeconomic History  . Marital status: Unknown    Spouse name: Not on file  . Number of children: Not on file  . Years of education: Not on file  . Highest education level: Not on file  Occupational History  . Not on file  Tobacco Use  . Smoking status: Never Smoker  . Smokeless tobacco: Never Used  Substance and Sexual Activity  . Alcohol use: Yes    Comment: occasionally  . Drug use: No  . Sexual activity: Yes    Birth control/protection: I.U.D.  Other Topics Concern  . 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 Difficulty of Paying Living Expenses:   Food  Insecurity:   . Worried About Charity fundraiser in the Last Year:   . Arboriculturist in the Last Year:   Transportation Needs:   . Film/video editor (Medical):   Marland Kitchen Lack of Transportation (Non-Medical):   Physical Activity:   . Days of Exercise per Week:   . Minutes of Exercise per Session:   Stress:   . Feeling of Stress :   Social Connections:   . Frequency of Communication with Friends and Family:   . Frequency of Social Gatherings with Friends and Family:   . Attends  Religious Services:   . Active Member of Clubs or Organizations:   . Attends Archivist Meetings:   Marland Kitchen Marital Status:      Family History: The patient's family history includes Cancer in her maternal grandfather and paternal grandfather; Diabetes in her maternal uncle, maternal uncle, and mother; Hyperlipidemia in her father, maternal grandmother, and mother; Hypertension in her mother.  ROS:   Please see the history of present illness.    All other systems reviewed and are negative.  EKGs/Labs/Other Studies Reviewed:    The following studies were reviewed today:  EKG:  EKG is ordered today.  The ekg ordered today demonstrates normal sinus rhythm, normal EKG, personally reviewed.  Recent Labs: 08/31/2019: Hemoglobin 13.8; Platelets 300.0; TSH 1.79 01/03/2020: ALT 12; BUN 11; Creatinine, Ser 1.07; NT-Pro BNP 16; Potassium 4.0; Sodium 135  Recent Lipid Panel    Component Value Date/Time   CHOL 211 (H) 08/31/2019 0756   TRIG 53.0 08/31/2019 0756   HDL 58.20 08/31/2019 0756   CHOLHDL 4 08/31/2019 0756   VLDL 10.6 08/31/2019 0756   LDLCALC 142 (H) 08/31/2019 0756   Physical Exam:    VS:  BP 116/76   Pulse 74   Ht 4\' 11"  (1.499 m)   Wt 117 lb 6.4 oz (53.3 kg)   LMP  (LMP Unknown)   SpO2 99%   BMI 23.71 kg/m    Wt Readings from Last 3 Encounters:  01/09/20 117 lb 6.4 oz (53.3 kg)  01/03/20 118 lb 3.2 oz (53.6 kg)  10/23/19 124 lb (56.2 kg)    GEN:  Well nourished, well developed in no acute distress HEENT: Normal NECK: No JVD; No carotid bruits LYMPHATICS: No lymphadenopathy CARDIAC: RRR, no murmurs, rubs, gallops RESPIRATORY:  Clear to auscultation without rales, wheezing or rhonchi  ABDOMEN: Soft, non-tender, non-distended MUSCULOSKELETAL:  No edema; No deformity  SKIN: Warm and dry NEUROLOGIC:  Alert and oriented x 3 PSYCHIATRIC:  Normal affect   ASSESSMENT:    1. SOB (shortness of breath)   2. Palpitations   3. Pure hypercholesterolemia    PLAN:      In order of problems listed above:  1. Shortness of breath and palpitations, -possibly related to SVT, we will obtain an echocardiogram and 1 day Zio patch monitor.  Her TSH was recently checked and is normal. 2. Hyperlipidemia -she is advised to start taking red yeast rice 600 mg daily.  She otherwise has healthy lifestyle and is very active.   Medication Adjustments/Labs and Tests Ordered: Current medicines are reviewed at length with the patient today.  Concerns regarding medicines are outlined above.  Orders Placed This Encounter  Procedures  . LONG TERM MONITOR (3-14 DAYS)  . EKG 12-Lead  . ECHOCARDIOGRAM COMPLETE   No orders of the defined types were placed in this encounter.   Patient Instructions  Medication Instructions:   Your physician recommends  that you continue on your current medications as directed. Please refer to the Current Medication list given to you today.  *If you need a refill on your cardiac medications before your next appointment, please call your pharmacy*  Testing/Procedures:  Your physician has requested that you have an echocardiogram. Echocardiography is a painless test that uses sound waves to create images of your heart. It provides your doctor with information about the size and shape of your heart and how well your heart's chambers and valves are working. This procedure takes approximately one hour. There are no restrictions for this procedure.  ZIO XT- Long Term Monitor Instructions   Your physician has requested you wear your ZIO patch monitor___14____days.   This is a single patch monitor.  Ir[employer] supplies one patch monitor per enrollment.  Additional stickers are not available.   Please do not apply patch if you will be having a Nuclear Stress Test, Echocardiogram, Cardiac CT, MRI, or Chest Xray during the time frame you would be wearing the monitor. The patch cannot be worn during these tests.  You cannot remove and re-apply the ZIO XT  patch monitor.   Your ZIO patch monitor will be sent USPS Priority mail from [facility] directly to your home address. The monitor may also be mailed to a PO BOX if home delivery is not available.   It may take 3-5 days to receive your monitor after you have been enrolled.   Once you have received you monitor, please review enclosed instructions.  Your monitor has already been registered assigning a specific monitor serial # to you.   Applying the monitor   Shave hair from upper left chest.   Hold abrader disc by orange tab.  Rub abrader in 40 strokes over left upper chest as indicated in your monitor instructions.   Clean area with 4 enclosed alcohol pads .  Use all pads to assure are is cleaned thoroughly.  Let dry.   Apply patch as indicated in monitor instructions.  Patch will be place under collarbone on left side of chest with arrow pointing upward.   Rub patch adhesive wings for 2 minutes.Remove white label marked "1".  Remove white label marked "2".  Rub patch adhesive wings for 2 additional minutes.   While looking in a mirror, press and release button in center of patch.  A small green light will flash 3-4 times .  This will be your only indicator the monitor has been turned on.     Do not shower for the first 24 hours.  You may shower after the first 24 hours.   Press button if you feel a symptom. You will hear a small click.  Record Date, Time and Symptom in the Patient Log Book.   When you are ready to remove patch, follow instructions on last 2 pages of Patient Log Book.  Stick patch monitor onto last page of Patient Log Book.   Place Patient Log Book in [place] box.  Use locking tab on box and tape box closed securely.  The [employer] and [employer] has [employer] on it.  Please place in mailbox as soon as possible.  Your physician should have your test results approximately 7 days after the monitor has been mailed back to [facility].   Call [place] at [phone] if you have questions regarding your ZIO XT patch monitor.  Call them immediately if you see an orange light blinking on your monitor.   If  your monitor falls off in less than 4 days contact our Monitor department at [phone].  If your monitor becomes loose or falls off after 4 days call [employer] at [phone] for suggestions on securing your monitor.     Follow-Up:  IN THE OFFICE TO SEE DR. Meda Coffee ON 03/25/20 AT HER 10:40 AM SLOT--SCHEDULING OK TO ADD PER DR. Kate Sable, Ena Dawley, MD  01/10/2020 8:54 AM    [place]

## 2020-01-09 NOTE — Telephone Encounter (Signed)
14 day Zio registered to be mailed to pt's home address.

## 2020-01-09 NOTE — Patient Instructions (Addendum)
Medication Instructions:   Your physician recommends that you continue on your current medications as directed. Please refer to the Current Medication list given to you today.  *If you need a refill on your cardiac medications before your next appointment, please call your pharmacy*  Testing/Procedures:  Your physician has requested that you have an echocardiogram. Echocardiography is a painless test that uses sound waves to create images of your heart. It provides your doctor with information about the size and shape of your heart and how well your heart's chambers and valves are working. This procedure takes approximately one hour. There are no restrictions for this procedure.  ZIO XT- Long Term Monitor Instructions   Your physician has requested you wear your ZIO patch monitor___14____days.   This is a single patch monitor.  Ir[employer] supplies one patch monitor per enrollment.  Additional stickers are not available.   Please do not apply patch if you will be having a Nuclear Stress Test, Echocardiogram, Cardiac CT, MRI, or Chest Xray during the time frame you would be wearing the monitor. The patch cannot be worn during these tests.  You cannot remove and re-apply the ZIO XT patch monitor.   Your ZIO patch monitor will be sent USPS Priority mail from [facility] directly to your home address. The monitor may also be mailed to a PO BOX if home delivery is not available.   It may take 3-5 days to receive your monitor after you have been enrolled.   Once you have received you monitor, please review enclosed instructions.  Your monitor has already been registered assigning a specific monitor serial # to you.   Applying the monitor   Shave hair from upper left chest.   Hold abrader disc by orange tab.  Rub abrader in 40 strokes over left upper chest as indicated in your monitor instructions.   Clean area with 4 enclosed alcohol pads .  Use all pads to assure are is cleaned  thoroughly.  Let dry.   Apply patch as indicated in monitor instructions.  Patch will be place under collarbone on left side of chest with arrow pointing upward.   Rub patch adhesive wings for 2 minutes.Remove white label marked "1".  Remove white label marked "2".  Rub patch adhesive wings for 2 additional minutes.   While looking in a mirror, press and release button in center of patch.  A small green light will flash 3-4 times .  This will be your only indicator the monitor has been turned on.     Do not shower for the first 24 hours.  You may shower after the first 24 hours.   Press button if you feel a symptom. You will hear a small click.  Record Date, Time and Symptom in the Patient Log Book.   When you are ready to remove patch, follow instructions on last 2 pages of Patient Log Book.  Stick patch monitor onto last page of Patient Log Book.   Place Patient Log Book in [place] box.  Use locking tab on box and tape box closed securely.  The [employer] has [employer] on it.  Please place in mailbox as soon as possible.  Your physician should have your test results approximately 7 days after the monitor has been mailed back to [facility].   Call [place] at [phone] if you have questions regarding your ZIO XT patch monitor.  Call them immediately if you see an orange light blinking  on your monitor.   If your monitor falls off in less than 4 days contact our Monitor department at [phone].  If your monitor becomes loose or falls off after 4 days call [employer] at [phone] for suggestions on securing your monitor.     Follow-Up:  IN THE OFFICE TO SEE DR. Meda Coffee ON 03/25/20 AT HER 10:40 AM SLOT--SCHEDULING OK TO ADD PER DR. Meda Coffee

## 2020-01-13 ENCOUNTER — Ambulatory Visit (INDEPENDENT_AMBULATORY_CARE_PROVIDER_SITE_OTHER): Payer: 59

## 2020-01-13 DIAGNOSIS — R002 Palpitations: Secondary | ICD-10-CM | POA: Diagnosis not present

## 2020-01-15 ENCOUNTER — Encounter: Payer: Self-pay | Admitting: Internal Medicine

## 2020-01-28 MED FILL — SPIRONOLACTONE 25 MG TABS: 25 | 30 days supply | Qty: 60 | Fill #3

## 2020-02-04 ENCOUNTER — Other Ambulatory Visit: Payer: Self-pay

## 2020-02-04 ENCOUNTER — Ambulatory Visit (HOSPITAL_COMMUNITY): Payer: 59 | Attending: Cardiovascular Disease

## 2020-02-04 DIAGNOSIS — R0602 Shortness of breath: Secondary | ICD-10-CM | POA: Diagnosis not present

## 2020-02-04 LAB — ECHOCARDIOGRAM COMPLETE
Area-P 1/2: 3.65 cm2
S' Lateral: 2.5 cm

## 2020-02-05 ENCOUNTER — Encounter: Payer: Self-pay | Admitting: Adult Health

## 2020-02-05 ENCOUNTER — Encounter: Payer: Self-pay | Admitting: Internal Medicine

## 2020-02-05 ENCOUNTER — Telehealth (INDEPENDENT_AMBULATORY_CARE_PROVIDER_SITE_OTHER): Payer: 59 | Admitting: Adult Health

## 2020-02-05 ENCOUNTER — Telehealth: Payer: Self-pay | Admitting: Cardiology

## 2020-02-05 VITALS — Wt 115.0 lb

## 2020-02-05 DIAGNOSIS — G43011 Migraine without aura, intractable, with status migrainosus: Secondary | ICD-10-CM | POA: Diagnosis not present

## 2020-02-05 MED ORDER — PREDNISONE 20 MG PO TABS: 20.0000 mg | ORAL_TABLET | Freq: Every day | ORAL | 0 refills | Status: DC

## 2020-02-05 MED ORDER — SUMATRIPTAN SUCCINATE 50 MG PO TABS: 50.0000 mg | ORAL_TABLET | ORAL | 1 refills | Status: DC | PRN

## 2020-02-05 MED ORDER — MECLIZINE HCL 25 MG PO TABS: 25.0000 mg | ORAL_TABLET | Freq: Three times a day (TID) | ORAL | 0 refills | Status: DC | PRN

## 2020-02-05 NOTE — Telephone Encounter (Signed)
Pt notified of echo results

## 2020-02-05 NOTE — Telephone Encounter (Signed)
Patient is calling to return call regarding echo results. Please call.

## 2020-02-05 NOTE — Progress Notes (Signed)
Virtual Visit via Video Note  I connected with Annette Gutierrez on 02/05/20 at 11:00 AM EDT by a video enabled telemedicine application and verified that I am speaking with the correct person using two identifiers.  Location patient: home Location provider:work or home office Persons participating in the virtual visit: patient, provider  I discussed the limitations of evaluation and management by telemedicine and the availability of in person appointments. The patient expressed understanding and agreed to proceed.   HPI: 39 year old female who is being evaluated today via video conference for migraine headache.  Headache started approximately 1 day ago and has been constant over the last 24 hours.  She reports photophobia and phonophobia.  Had nausea yesterday but none today.  Denies aura.  Headache is located over her left eye but will radiate more towards the center of her forehead.  She used Fioricet last night which helped her sleep but did not abort the headache   ROS: See pertinent positives and negatives per HPI.  Past Medical History:  Diagnosis Date  . Acne vulgaris 11/21/2018  . Blood in stool   . Hyperlipidemia 04/19/2019  . Lipoma of back   . Vestibular neuritis    2010    Past Surgical History:  Procedure Laterality Date  . LIPOMA EXCISION Left 12/21/2018   Procedure: EXCISION LIPOMA OF BACK;  Surgeon: Wallace Going, DO;  Location: [facility];  Service: Plastics;  Laterality: Left;    Family History  Problem Relation Age of Onset  . Diabetes Mother   . Hyperlipidemia Mother   . Hypertension Mother   . Hyperlipidemia Father   . Diabetes Maternal Uncle   . Hyperlipidemia Maternal Grandmother   . Cancer Maternal Grandfather        lung smoker  . Cancer Paternal Grandfather        colon  . Diabetes Maternal Uncle        Current Outpatient Medications:  .  clindamycin (CLINDAGEL) 1 % gel, Apply topically 2 (two) times daily., Disp: , Rfl:  .   Cyanocobalamin (VITAMIN B-12 PO), Take 3,000 mcg by mouth daily., Disp: , Rfl:  .  levonorgestrel (MIRENA) 20 MCG/24HR IUD, 1 each by Intrauterine route once., Disp: , Rfl:  .  Multiple Vitamin (MULTIVITAMIN) tablet, Take 1 tablet by mouth daily., Disp: , Rfl:  .  NON FORMULARY, cyspera bleaching cream qhs, Disp: , Rfl:  .  spironolactone (ALDACTONE) 25 MG tablet, Take 50 mg by mouth daily., Disp: , Rfl:  .  tretinoin (RETIN-A) 0.025 % cream, Apply topically at bedtime. qhs, Disp: 45 g, Rfl: 11 .  valACYclovir (VALTREX) 500 MG tablet, Take 500 mg by mouth daily as needed., Disp: , Rfl:   EXAM:  VITALS per patient if applicable:  GENERAL: alert, oriented, appears well and in no acute distress  HEENT: atraumatic, conjunttiva clear, no obvious abnormalities on inspection of external nose and ears  NECK: normal movements of the head and neck  LUNGS: on inspection no signs of respiratory distress, breathing rate appears normal, no obvious gross SOB, gasping or wheezing  CV: no obvious cyanosis  MS: moves all visible extremities without noticeable abnormality  PSYCH/NEURO: pleasant and cooperative, no obvious depression or anxiety, speech and thought processing grossly intact  ASSESSMENT AND PLAN:  Discussed the following assessment and plan:  1. Intractable migraine without aura and with status migrainosus  - SUMAtriptan (IMITREX) 50 MG tablet; Take 1 tablet (50 mg total) by mouth every 2 (two) hours as  needed for migraine. May repeat in 2 hours if headache persists or recurs.  Dispense: 10 tablet; Refill: 1 - predniSONE (DELTASONE) 20 MG tablet; Take 1 tablet (20 mg total) by mouth daily with breakfast.  Dispense: 5 tablet; Refill: 0 - meclizine (ANTIVERT) 25 MG tablet; Take 1 tablet (25 mg total) by mouth 3 (three) times daily as needed for dizziness.  Dispense: 30 tablet; Refill: 0     I discussed the assessment and treatment plan with the patient. The patient was provided an  opportunity to ask questions and all were answered. The patient agreed with the plan and demonstrated an understanding of the instructions.   The patient was advised to call back or seek an in-person evaluation if the symptoms worsen or if the condition fails to improve as anticipated.   Dorothyann Peng, NP

## 2020-02-09 DIAGNOSIS — R002 Palpitations: Secondary | ICD-10-CM | POA: Diagnosis not present

## 2020-02-18 DIAGNOSIS — F4323 Adjustment disorder with mixed anxiety and depressed mood: Secondary | ICD-10-CM | POA: Diagnosis not present

## 2020-02-20 DIAGNOSIS — Z309 Encounter for contraceptive management, unspecified: Secondary | ICD-10-CM | POA: Diagnosis not present

## 2020-02-20 DIAGNOSIS — Z113 Encounter for screening for infections with a predominantly sexual mode of transmission: Secondary | ICD-10-CM | POA: Diagnosis not present

## 2020-02-20 DIAGNOSIS — Z01419 Encounter for gynecological examination (general) (routine) without abnormal findings: Secondary | ICD-10-CM | POA: Diagnosis not present

## 2020-02-26 DIAGNOSIS — F4323 Adjustment disorder with mixed anxiety and depressed mood: Secondary | ICD-10-CM | POA: Diagnosis not present

## 2020-03-06 MED FILL — SPIRONOLACTONE 25 MG TABS: 25 | 30 days supply | Qty: 60 | Fill #4

## 2020-03-11 DIAGNOSIS — F4323 Adjustment disorder with mixed anxiety and depressed mood: Secondary | ICD-10-CM | POA: Diagnosis not present

## 2020-03-17 DIAGNOSIS — Z03818 Encounter for observation for suspected exposure to other biological agents ruled out: Secondary | ICD-10-CM | POA: Diagnosis not present

## 2020-03-17 DIAGNOSIS — Z20822 Contact with and (suspected) exposure to covid-19: Secondary | ICD-10-CM | POA: Diagnosis not present

## 2020-03-25 ENCOUNTER — Encounter: Payer: Self-pay | Admitting: Cardiology

## 2020-03-25 ENCOUNTER — Ambulatory Visit: Payer: 59 | Admitting: Cardiology

## 2020-03-25 ENCOUNTER — Other Ambulatory Visit: Payer: Self-pay

## 2020-03-25 VITALS — BP 116/64 | HR 72 | Ht 59.0 in | Wt 115.4 lb

## 2020-03-25 DIAGNOSIS — R0600 Dyspnea, unspecified: Secondary | ICD-10-CM

## 2020-03-25 DIAGNOSIS — E785 Hyperlipidemia, unspecified: Secondary | ICD-10-CM | POA: Diagnosis not present

## 2020-03-25 DIAGNOSIS — R0602 Shortness of breath: Secondary | ICD-10-CM | POA: Diagnosis not present

## 2020-03-25 DIAGNOSIS — E7800 Pure hypercholesterolemia, unspecified: Secondary | ICD-10-CM

## 2020-03-25 DIAGNOSIS — R0609 Other forms of dyspnea: Secondary | ICD-10-CM

## 2020-03-25 MED ORDER — METOPROLOL TARTRATE 50 MG PO TABS: 50.0000 mg | ORAL_TABLET | Freq: Once | ORAL | 0 refills | Status: DC

## 2020-03-25 MED ORDER — RED YEAST RICE 600 MG PO TABS: 600.0000 mg | ORAL_TABLET | Freq: Every day | ORAL | 1 refills | Status: DC

## 2020-03-25 MED FILL — METOPROLOL TARTRATE 50 MG T: 50 | 1 days supply | Qty: 1 | Fill #0

## 2020-03-25 NOTE — Progress Notes (Signed)
Cardiology Office Note:    Date:  03/25/2020   ID:  Annette Gutierrez, DOB December 21, 1980, MRN [id number]  PCP:  McLean-Scocuzza, Nino Glow, MD  [facility] Cardiologist:  Ena Dawley, MD  [place] Electrophysiologist:  None   Referring MD: McLean-Scocuzza, Olivia Mackie *   Chief complaint: Shortness of breath, palpitations  History of Present Illness:    Annette Gutierrez is a 39 y.o. female with no significant past medical history, no cardiac history and no family history of premature coronary artery disease or sudden cardiac death. The patient is premenopausal, has 1 healthy child 37 year old, she had uncomplicated pregnancy with no gestational diabetes or hypertension.  The patient started to develop symptoms of palpitations associated with shortness of breath about 3 weeks ago, they are not related to exertion, usually happen at rest, they start all sudden abruptly and slowly resolved within minutes.  She underwent chest x-ray that was completely normal, she got the whole panel of labs that showed normal electrolytes liver and kidney function, normal CBC, normal thyroid function, normal D-dimer. She denies any fever chills or any other unusual condition prior experiencing these symptoms.  These palpitation are not associated with presyncopal or syncopal episodes.  She has no lower extremity edema orthopnea proximal nocturnal dyspnea.  03/25/2020 -the patient is coming for follow, her echocardiogram showed normal systolic and diastolic function, normal global longitudinal strain, no valvular abnormalities.  10-day Zio patch event monitor showed sinus bradycardia to sinus tachycardia with occasional PVCs.  Her mutations had improved however she continues to have shortness of breath sometimes twice a day.  Past Medical History:  Diagnosis Date  . Acne vulgaris 11/21/2018  . Blood in stool   . Hyperlipidemia 04/19/2019  . Lipoma of back   . Vestibular neuritis    2010   Past Surgical History:    Procedure Laterality Date  . LIPOMA EXCISION Left 12/21/2018   Procedure: EXCISION LIPOMA OF BACK;  Surgeon: Wallace Going, DO;  Location: [place];  Service: Plastics;  Laterality: Left;    Current Medications: Current Meds  Medication Sig  . clindamycin (CLINDAGEL) 1 % gel Apply topically 2 (two) times daily.  . Cyanocobalamin (VITAMIN B-12 PO) Take 3,000 mcg by mouth daily.  Marland Kitchen levonorgestrel (MIRENA) 20 MCG/24HR IUD 1 each by Intrauterine route once.  . meclizine (ANTIVERT) 25 MG tablet Take 1 tablet (25 mg total) by mouth 3 (three) times daily as needed for dizziness.  . Multiple Vitamin (MULTIVITAMIN) tablet Take 1 tablet by mouth daily.  . NON FORMULARY cyspera bleaching cream qhs  . predniSONE (DELTASONE) 20 MG tablet Take 1 tablet (20 mg total) by mouth daily with breakfast.  . spironolactone (ALDACTONE) 25 MG tablet Take 50 mg by mouth daily.  . SUMAtriptan (IMITREX) 50 MG tablet Take 1 tablet (50 mg total) by mouth every 2 (two) hours as needed for migraine. May repeat in 2 hours if headache persists or recurs.  . tretinoin (RETIN-A) 0.025 % cream Apply topically at bedtime. qhs  . valACYclovir (VALTREX) 500 MG tablet Take 500 mg by mouth daily as needed.     Allergies:   Patient has no known allergies.   Social History   Socioeconomic History  . Marital status: Unknown    Spouse name: Not on file  . Number of children: Not on file  . Years of education: Not on file  . Highest education level: Not on file  Occupational History  . Not on file  Tobacco  Use  . Smoking status: Never Smoker  . Smokeless tobacco: Never Used  Substance and Sexual Activity  . Alcohol use: Yes    Comment: occasionally  . Drug use: No  . Sexual activity: Yes    Birth control/protection: I.U.D.  Other Topics Concern  . 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 Difficulty of Paying Living Expenses: Not on file  Food Insecurity:   . Worried About Charity fundraiser in the Last Year: Not on file  . Ran Out of Food in the Last Year: Not on file  Transportation Needs:   . Lack of Transportation (Medical): Not on file  . Lack of Transportation (Non-Medical): Not on file  Physical Activity:   . Days of Exercise per Week: Not on file  . Minutes of Exercise per Session: Not on file  Stress:   . Feeling of Stress : Not on file  Social Connections:   . Frequency of Communication with Friends and Family: Not on file  . Frequency of Social Gatherings with Friends and Family: Not on file  . Attends Religious Services: Not on file  . Active Member of Clubs or Organizations: Not on file  . Attends Archivist Meetings: Not on file  . Marital Status: Not on file     Family History: The patient's family history includes Cancer in her maternal grandfather and paternal grandfather; Diabetes in her maternal uncle, maternal uncle, and mother; Hyperlipidemia in her father, maternal grandmother, and mother; Hypertension in her mother.  ROS:   Please see the history of present illness.    All other systems reviewed and are negative.  TTE: 02/04/2020  1. Left ventricular ejection fraction, by estimation, is 60 to 65%. The  left ventricle has normal function. The left ventricle has no regional  wall motion abnormalities. Left ventricular diastolic parameters were  normal. The average left ventricular  global longitudinal strain is -25.6 %. The global longitudinal strain is  normal.  2. Right ventricular systolic function is normal. The right ventricular  size is normal. There is normal pulmonary artery systolic pressure. The  estimated right ventricular systolic pressure is 54.5 mmHg.  3. The mitral valve is grossly normal. Trivial mitral valve  regurgitation. No  evidence of mitral stenosis.  4. The aortic valve is tricuspid. Aortic valve regurgitation is not  visualized. No aortic stenosis is present.  5. The inferior vena cava is normal in size with greater than 50%  respiratory variability, suggesting right atrial pressure of 3 mmHg.     EKGs/Labs/Other Studies Reviewed:    The following studies were reviewed today:  EKG:  EKG is ordered today.  The ekg ordered today demonstrates normal sinus rhythm, normal EKG, personally reviewed.  Recent Labs: 08/31/2019: Hemoglobin 13.8; Platelets 300.0; TSH 1.79 01/03/2020: ALT 12; BUN 11; Creatinine, Ser 1.07; NT-Pro BNP 16; Potassium 4.0; Sodium 135  Recent Lipid Panel    Component Value Date/Time   CHOL 211 (H) 08/31/2019 0756   TRIG 53.0 08/31/2019 0756   HDL 58.20 08/31/2019 0756   CHOLHDL 4 08/31/2019 0756   VLDL 10.6 08/31/2019 0756   LDLCALC 142 (H) 08/31/2019 0756   Physical Exam:    VS:  BP 116/64   Pulse 72  Ht _0  (1.499 m)   Wt 115 lb 6.4 oz (52.3 kg)   SpO2 97%   BMI 23.31 kg/m    Wt Readings from Last 3 Encounters:  03/25/20 115 lb 6.4 oz (52.3 kg)  02/05/20 115 lb (52.2 kg)  01/09/20 117 lb 6.4 oz (53.3 kg)    GEN:  Well nourished, well developed in no acute distress HEENT: Normal NECK: No JVD; No carotid bruits LYMPHATICS: No lymphadenopathy CARDIAC: RRR, no murmurs, rubs, gallops RESPIRATORY:  Clear to auscultation without rales, wheezing or rhonchi  ABDOMEN: Soft, non-tender, non-distended MUSCULOSKELETAL:  No edema; No deformity  SKIN: Warm and dry NEUROLOGIC:  Alert and oriented x 3 PSYCHIATRIC:  Normal affect   ASSESSMENT:    1. DOE (dyspnea on exertion)   2. SOB (shortness of breath)   3. Pure hypercholesterolemia   4. Hyperlipidemia, unspecified hyperlipidemia type    PLAN:    In order of problems listed above:  1. Shortness of breath -with hyperlipidemia, obstructive coronary artery disease is highly unlikely however given her age she might  have anomalous coronary arteries, will obtain coronary CTA to further evaluate. 2. Palpitations -10-day Zio patch monitor showed only normal sinus rhythm with occasional PVCs.  Her symptoms did not correlate with any arrhythmias. 3. Hyperlipidemia -she is advised to start taking red yeast rice 600 mg daily.  She otherwise has healthy lifestyle and is very active.  Medication Adjustments/Labs and Tests Ordered: Current medicines are reviewed at length with the patient today.  Concerns regarding medicines are outlined above.  Orders Placed This Encounter  Procedures  . CT CORONARY MORPH W/CTA COR W/SCORE W/CA W/CM &/OR WO/CM  . CT CORONARY FRACTIONAL FLOW RESERVE DATA PREP  . CT CORONARY FRACTIONAL FLOW RESERVE FLUID ANALYSIS   Meds ordered this encounter  Medications  . metoprolol tartrate (LOPRESSOR) 50 MG tablet    Sig: Take 1 tablet (50 mg total) by mouth once for 1 dose. Take 2 hours prior to your Coronary CT.    Dispense:  1 tablet    Refill:  0  . Red Yeast Rice 600 MG TABS    Sig: Take 1 tablet (600 mg total) by mouth daily.    Dispense:  90 tablet    Refill:  1   Patient Instructions  Medication Instructions:   START TAKING RED YEAST RICE 600 MG BY MOUTH DAILY  *If you need a refill on your cardiac medications before your next appointment, please call your pharmacy*   Testing/Procedures:  Your cardiac CT will be scheduled at one of the below locations:   [facility] [address] [place], [place] [address] [phone]  If scheduled at [facility], please arrive at the [facility] main entrance of [facility] 30 minutes prior to test start time. Proceed to the [facility] Radiology Department (first floor) to check-in and test prep.  Please follow these instructions carefully (unless otherwise directed):  On the Night Before the Test: . Be sure to Drink plenty of water. . Do not consume any caffeinated/decaffeinated beverages or  chocolate 12 hours prior to your test. . Do not take any antihistamines 12 hours prior to your test.  On the Day of the Test: . Drink plenty of water. Do not drink any water within one hour of the test. . Do not eat any food 4 hours prior to the test. . You may take your regular medications prior to the test.  . Take metoprolol 50  MG BY MOUTH  (Lopressor) two hours prior to test. . HOLD SPIRONOLACTONE THE morning of the test. . FEMALES- please wear underwire-free bra if available      After the Test: . Drink plenty of water. . After receiving IV contrast, you may experience a mild flushed feeling. This is normal. . On occasion, you may experience a mild rash up to 24 hours after the test. This is not dangerous. If this occurs, you can take Benadryl 25 mg and increase your fluid intake. . If you experience trouble breathing, this can be serious. If it is severe call 911 IMMEDIATELY. If it is mild, please call our office.  Once we have confirmed authorization from your insurance company, we will call you to set up a date and time for your test. Based on how quickly your insurance processes prior authorizations requests, please allow up to 4 weeks to be contacted for scheduling your Cardiac CT appointment. Be advised that routine Cardiac CT appointments could be scheduled as many as 8 weeks after your provider has ordered it.  For non-scheduling related questions, please contact the cardiac imaging nurse navigator should you have any questions/concerns: Marchia Bond, Cardiac Imaging Nurse Navigator Burley Saver, Interim Cardiac Imaging Nurse [place] [facility] Direct Office Dial: [phone]   For scheduling needs, including cancellations and rescheduling, please call Vivien Rota at [phone], option 3.   Follow-Up: At [facility], you and your health needs are our priority.  As part of our continuing mission to provide you with exceptional heart care, we have  created designated Provider Care Teams.  These Care Teams include your primary Cardiologist (physician) and Advanced Practice Providers (APPs -  Physician Assistants and Nurse Practitioners) who all work together to provide you with the care you need, when you need it.  We recommend signing up for the patient portal called "MyChart".  Sign up information is provided on this After Visit Summary.  MyChart is used to connect with patients for Virtual Visits (Telemedicine).  Patients are able to view lab/test results, encounter notes, upcoming appointments, etc.  Non-urgent messages can be sent to your provider as well.   To learn more about what you can do with MyChart, go to [email or web address].    Your next appointment:   6 month(s)  The format for your next appointment:   In Person  Provider:   Ena Dawley, MD        Signed, Ena Dawley, MD  03/25/2020 1:37 PM    [place]

## 2020-03-25 NOTE — Patient Instructions (Signed)
Medication Instructions:   START TAKING RED YEAST RICE 600 MG BY MOUTH DAILY  *If you need a refill on your cardiac medications before your next appointment, please call your pharmacy*   Testing/Procedures:  Your cardiac CT will be scheduled at one of the below locations:   [facility] [address] [place], [place] [address] [phone]  If scheduled at [facility], please arrive at the [facility] main entrance of [facility] 30 minutes prior to test start time. Proceed to the [facility] Radiology Department (first floor) to check-in and test prep.  Please follow these instructions carefully (unless otherwise directed):  On the Night Before the Test:  Be sure to Drink plenty of water.  Do not consume any caffeinated/decaffeinated beverages or chocolate 12 hours prior to your test.  Do not take any antihistamines 12 hours prior to your test.  On the Day of the Test:  Drink plenty of water. Do not drink any water within one hour of the test.  Do not eat any food 4 hours prior to the test.  You may take your regular medications prior to the test.   Take metoprolol 50 MG BY MOUTH  (Lopressor) two hours prior to test.  HOLD SPIRONOLACTONE THE morning of the test.  FEMALES- please wear underwire-free bra if available      After the Test:  Drink plenty of water.  After receiving IV contrast, you may experience a mild flushed feeling. This is normal.  On occasion, you may experience a mild rash up to 24 hours after the test. This is not dangerous. If this occurs, you can take Benadryl 25 mg and increase your fluid intake.  If you experience trouble breathing, this can be serious. If it is severe call 911 IMMEDIATELY. If it is mild, please call our office.  Once we have confirmed authorization from your insurance company, we will call you to set up a date and time for your test. Based on how quickly your insurance processes prior  authorizations requests, please allow up to 4 weeks to be contacted for scheduling your Cardiac CT appointment. Be advised that routine Cardiac CT appointments could be scheduled as many as 8 weeks after your provider has ordered it.  For non-scheduling related questions, please contact the cardiac imaging nurse navigator should you have any questions/concerns: Marchia Bond, Cardiac Imaging Nurse Navigator Burley Saver, Interim Cardiac Imaging Nurse [place] and Vascular Services Direct Office Dial: [phone]   For scheduling needs, including cancellations and rescheduling, please call Vivien Rota at [phone], option 3.   Follow-Up: At [facility], you and your health needs are our priority.  As part of our continuing mission to provide you with exceptional heart care, we have created designated Provider Care Teams.  These Care Teams include your primary Cardiologist (physician) and Advanced Practice Providers (APPs -  Physician Assistants and Nurse Practitioners) who all work together to provide you with the care you need, when you need it.  We recommend signing up for the patient portal called "MyChart".  Sign up information is provided on this After Visit Summary.  MyChart is used to connect with patients for Virtual Visits (Telemedicine).  Patients are able to view lab/test results, encounter notes, upcoming appointments, etc.  Non-urgent messages can be sent to your provider as well.   To learn more about what you can do with MyChart, go to [email or web address].    Your next appointment:   6 month(s)  The format for your next appointment:   In Person  Provider:   Ena Dawley, MD

## 2020-04-01 DIAGNOSIS — F4323 Adjustment disorder with mixed anxiety and depressed mood: Secondary | ICD-10-CM | POA: Diagnosis not present

## 2020-04-14 ENCOUNTER — Telehealth: Payer: Self-pay | Admitting: Internal Medicine

## 2020-04-14 NOTE — Telephone Encounter (Signed)
LVM for pt TCB and schedule appt//tsb Consult - SOB with exertion//referred by McLean-Scocuzza, Tracy//tsb" [facility] said on Feb 07, 2020 10:12 AM

## 2020-04-17 MED FILL — SPIRONOLACTONE 25 MG TABS: 25 | 30 days supply | Qty: 60 | Fill #5

## 2020-04-22 ENCOUNTER — Encounter: Payer: 59 | Admitting: Internal Medicine

## 2020-04-22 DIAGNOSIS — F4323 Adjustment disorder with mixed anxiety and depressed mood: Secondary | ICD-10-CM | POA: Diagnosis not present

## 2020-04-24 ENCOUNTER — Encounter: Payer: Self-pay | Admitting: Internal Medicine

## 2020-04-24 ENCOUNTER — Other Ambulatory Visit: Payer: Self-pay

## 2020-04-24 ENCOUNTER — Ambulatory Visit (INDEPENDENT_AMBULATORY_CARE_PROVIDER_SITE_OTHER): Payer: 59 | Admitting: Internal Medicine

## 2020-04-24 VITALS — BP 124/80 | HR 69 | Temp 97.2°F | Ht 59.45 in | Wt 115.4 lb

## 2020-04-24 DIAGNOSIS — Z23 Encounter for immunization: Secondary | ICD-10-CM | POA: Diagnosis not present

## 2020-04-24 DIAGNOSIS — I491 Atrial premature depolarization: Secondary | ICD-10-CM

## 2020-04-24 DIAGNOSIS — Z13818 Encounter for screening for other digestive system disorders: Secondary | ICD-10-CM

## 2020-04-24 DIAGNOSIS — E538 Deficiency of other specified B group vitamins: Secondary | ICD-10-CM | POA: Diagnosis not present

## 2020-04-24 DIAGNOSIS — I493 Ventricular premature depolarization: Secondary | ICD-10-CM

## 2020-04-24 DIAGNOSIS — Z0000 Encounter for general adult medical examination without abnormal findings: Secondary | ICD-10-CM

## 2020-04-24 DIAGNOSIS — G43011 Migraine without aura, intractable, with status migrainosus: Secondary | ICD-10-CM

## 2020-04-24 DIAGNOSIS — E785 Hyperlipidemia, unspecified: Secondary | ICD-10-CM | POA: Diagnosis not present

## 2020-04-24 DIAGNOSIS — R002 Palpitations: Secondary | ICD-10-CM | POA: Diagnosis not present

## 2020-04-24 DIAGNOSIS — E559 Vitamin D deficiency, unspecified: Secondary | ICD-10-CM

## 2020-04-24 DIAGNOSIS — Z1389 Encounter for screening for other disorder: Secondary | ICD-10-CM | POA: Diagnosis not present

## 2020-04-24 LAB — CBC WITH DIFFERENTIAL/PLATELET
Basophils Absolute: 0.1 10*3/uL (ref 0.0–0.1)
Basophils Relative: 0.7 % (ref 0.0–3.0)
Eosinophils Absolute: 0.1 10*3/uL (ref 0.0–0.7)
Eosinophils Relative: 0.6 % (ref 0.0–5.0)
HCT: 40 % (ref 36.0–46.0)
Hemoglobin: 13.5 g/dL (ref 12.0–15.0)
Lymphocytes Relative: 27 % (ref 12.0–46.0)
Lymphs Abs: 2.7 10*3/uL (ref 0.7–4.0)
MCHC: 33.8 g/dL (ref 30.0–36.0)
MCV: 86.9 fl (ref 78.0–100.0)
Monocytes Absolute: 0.7 10*3/uL (ref 0.1–1.0)
Monocytes Relative: 6.8 % (ref 3.0–12.0)
Neutro Abs: 6.5 10*3/uL (ref 1.4–7.7)
Neutrophils Relative %: 64.9 % (ref 43.0–77.0)
Platelets: 303 10*3/uL (ref 150.0–400.0)
RBC: 4.6 Mil/uL (ref 3.87–5.11)
RDW: 13.4 % (ref 11.5–15.5)
WBC: 10 10*3/uL (ref 4.0–10.5)

## 2020-04-24 LAB — LIPID PANEL
Cholesterol: 180 mg/dL (ref 0–200)
HDL: 58.8 mg/dL (ref >39.00)
LDL Cholesterol: 113 mg/dL — ABNORMAL HIGH (ref 0–99)
NonHDL: 121.68
Total CHOL/HDL Ratio: 3
Triglycerides: 44 mg/dL (ref 0.0–149.0)
VLDL: 8.8 mg/dL (ref 0.0–40.0)

## 2020-04-24 LAB — BASIC METABOLIC PANEL
BUN: 11 mg/dL (ref 6–23)
CO2: 27 mEq/L (ref 19–32)
Calcium: 9.6 mg/dL (ref 8.4–10.5)
Chloride: 103 mEq/L (ref 96–112)
Creatinine, Ser: 0.81 mg/dL (ref 0.40–1.20)
GFR: 92.66 mL/min (ref >60.00)
Glucose, Bld: 64 mg/dL — ABNORMAL LOW (ref 70–99)
Potassium: 4.1 mEq/L (ref 3.5–5.1)
Sodium: 137 mEq/L (ref 135–145)

## 2020-04-24 LAB — VITAMIN D 25 HYDROXY (VIT D DEFICIENCY, FRACTURES): VITD: 29.88 ng/mL — ABNORMAL LOW (ref 30.00–100.00)

## 2020-04-24 LAB — VITAMIN B12: Vitamin B-12: 573 pg/mL (ref 211–911)

## 2020-04-24 MED ORDER — SUMATRIPTAN SUCCINATE 50 MG PO TABS: 50.0000 mg | ORAL_TABLET | ORAL | 2 refills | Status: DC | PRN

## 2020-04-24 MED FILL — SUMATRIPTAN SUCC 50 MG TAB: 50 | 30 days supply | Qty: 10 | Fill #0

## 2020-04-24 NOTE — Patient Instructions (Addendum)
Nature made cholestoff  or Red yeast rice  Magnesium oxide 250 mg daily migraine prevention   Migraine Headache A migraine headache is an intense, throbbing pain on one side or both sides of the head. Migraine headaches may also cause other symptoms, such as nausea, vomiting, and sensitivity to light and noise. A migraine headache can last from 4 hours to 3 days. Talk with your doctor about what things may bring on (trigger) your migraine headaches. What are the causes? The exact cause of this condition is not known. However, a migraine may be caused when nerves in the brain become irritated and release chemicals that cause inflammation of blood vessels. This inflammation causes pain. This condition may be triggered or caused by:  Drinking alcohol.  Smoking.  Taking medicines, such as: ? Medicine used to treat chest pain (nitroglycerin). ? Birth control pills. ? Estrogen. ? Certain blood pressure medicines.  Eating or drinking products that contain nitrates, glutamate, aspartame, or tyramine. Aged cheeses, chocolate, or caffeine may also be triggers.  Doing physical activity. Other things that may trigger a migraine headache include:  Menstruation.  Pregnancy.  Hunger.  Stress.  Lack of sleep or too much sleep.  Weather changes.  Fatigue. What increases the risk? The following factors may make you more likely to experience migraine headaches:  Being a certain age. This condition is more common in people who are 21-70 years old.  Being female.  Having a family history of migraine headaches.  Being Caucasian.  Having a mental health condition, such as depression or anxiety.  Being obese. What are the signs or symptoms? The main symptom of this condition is pulsating or throbbing pain. This pain may:  Happen in any area of the head, such as on one side or both sides.  Interfere with daily activities.  Get worse with physical activity.  Get worse with exposure  to bright lights or loud noises. Other symptoms may include:  Nausea.  Vomiting.  Dizziness.  General sensitivity to bright lights, loud noises, or smells. Before you get a migraine headache, you may get warning signs (an aura). An aura may include:  Seeing flashing lights or having blind spots.  Seeing bright spots, halos, or zigzag lines.  Having tunnel vision or blurred vision.  Having numbness or a tingling feeling.  Having trouble talking.  Having muscle weakness. Some people have symptoms after a migraine headache (postdromal phase), such as:  Feeling tired.  Difficulty concentrating. How is this diagnosed? A migraine headache can be diagnosed based on:  Your symptoms.  A physical exam.  Tests, such as: ? CT scan or an MRI of the head. These imaging tests can help rule out other causes of headaches. ? Taking fluid from the spine (lumbar puncture) and analyzing it (cerebrospinal fluid analysis, or CSF analysis). How is this treated? This condition may be treated with medicines that:  Relieve pain.  Relieve nausea.  Prevent migraine headaches. Treatment for this condition may also include:  Acupuncture.  Lifestyle changes like avoiding foods that trigger migraine headaches.  Biofeedback.  Cognitive behavioral therapy. Follow these instructions at home: Medicines  Take over-the-counter and prescription medicines only as told by your health care provider.  Ask your health care provider if the medicine prescribed to you: ? Requires you to avoid driving or using heavy machinery. ? Can cause constipation. You may need to take these actions to prevent or treat constipation:  Drink enough fluid to keep your urine pale yellow.  Take  over-the-counter or prescription medicines.  Eat foods that are high in fiber, such as beans, whole grains, and fresh fruits and vegetables.  Limit foods that are high in fat and processed sugars, such as fried or sweet  foods. Lifestyle  Do not drink alcohol.  Do not use any products that contain nicotine or tobacco, such as cigarettes, e-cigarettes, and chewing tobacco. If you need help quitting, ask your health care provider.  Get at least 8 hours of sleep every night.  Find ways to manage stress, such as meditation, deep breathing, or yoga. General instructions      Keep a journal to find out what may trigger your migraine headaches. For example, write down: ? What you eat and drink. ? How much sleep you get. ? Any change to your diet or medicines.  If you have a migraine headache: ? Avoid things that make your symptoms worse, such as bright lights. ? It may help to lie down in a dark, quiet room. ? Do not drive or use heavy machinery. ? Ask your health care provider what activities are safe for you while you are experiencing symptoms.  Keep all follow-up visits as told by your health care provider. This is important. Contact a health care provider if:  You develop symptoms that are different or more severe than your usual migraine headache symptoms.  You have more than 15 headache days in one month. Get help right away if:  Your migraine headache becomes severe.  Your migraine headache lasts longer than 72 hours.  You have a fever.  You have a stiff neck.  You have vision loss.  Your muscles feel weak or like you cannot control them.  You start to lose your balance often.  You have trouble walking.  You faint.  You have a seizure. Summary  A migraine headache is an intense, throbbing pain on one side or both sides of the head. Migraines may also cause other symptoms, such as nausea, vomiting, and sensitivity to light and noise.  This condition may be treated with medicines and lifestyle changes. You may also need to avoid certain things that trigger a migraine headache.  Keep a journal to find out what may trigger your migraine headaches.  Contact your health care  provider if you have more than 15 headache days in a month or you develop symptoms that are different or more severe than your usual migraine headache symptoms. This information is not intended to replace advice given to you by your health care provider. Make sure you discuss any questions you have with your health care provider. Document Revised: 10/27/2018 Document Reviewed: 08/17/2018 Elsevier Patient Education  [place].   High Cholesterol  High cholesterol is a condition in which the blood has high levels of a white, waxy, fat-like substance (cholesterol). The human body needs small amounts of cholesterol. The liver makes all the cholesterol that the body needs. Extra (excess) cholesterol comes from the food that we eat. Cholesterol is carried from the liver by the blood through the blood vessels. If you have high cholesterol, deposits (plaques) may build up on the walls of your blood vessels (arteries). Plaques make the arteries narrower and stiffer. Cholesterol plaques increase your risk for heart attack and stroke. Work with your health care provider to keep your cholesterol levels in a healthy range. What increases the risk? This condition is more likely to develop in people who:  Eat foods that are high in animal fat (saturated fat)  or cholesterol.  Are overweight.  Are not getting enough exercise.  Have a family history of high cholesterol. What are the signs or symptoms? There are no symptoms of this condition. How is this diagnosed? This condition may be diagnosed from the results of a blood test.  If you are older than age 7, your health care provider may check your cholesterol every 4-6 years.  You may be checked more often if you already have high cholesterol or other risk factors for heart disease. The blood test for cholesterol measures:  "Bad" cholesterol (LDL cholesterol). This is the main type of cholesterol that causes heart disease. The desired level  for LDL is less than 100.  "Good" cholesterol (HDL cholesterol). This type helps to protect against heart disease by cleaning the arteries and carrying the LDL away. The desired level for HDL is 60 or higher.  Triglycerides. These are fats that the body can store or burn for energy. The desired number for triglycerides is lower than 150.  Total cholesterol. This is a measure of the total amount of cholesterol in your blood, including LDL cholesterol, HDL cholesterol, and triglycerides. A healthy number is less than 200. How is this treated? This condition is treated with diet changes, lifestyle changes, and medicines. Diet changes  This may include eating more whole grains, fruits, vegetables, nuts, and fish.  This may also include cutting back on red meat and foods that have a lot of added sugar. Lifestyle changes  Changes may include getting at least 40 minutes of aerobic exercise 3 times a week. Aerobic exercises include walking, biking, and swimming. Aerobic exercise along with a healthy diet can help you maintain a healthy weight.  Changes may also include quitting smoking. Medicines  Medicines are usually given if diet and lifestyle changes have failed to reduce your cholesterol to healthy levels.  Your health care provider may prescribe a statin medicine. Statin medicines have been shown to reduce cholesterol, which can reduce the risk of heart disease. Follow these instructions at home: Eating and drinking If told by your health care provider:  Eat chicken (without skin), fish, veal, shellfish, ground [place] breast, and round or loin cuts of red meat.  Do not eat fried foods or fatty meats, such as hot dogs and salami.  Eat plenty of fruits, such as apples.  Eat plenty of vegetables, such as broccoli, potatoes, and carrots.  Eat beans, peas, and lentils.  Eat grains such as barley, rice, couscous, and bulgur wheat.  Eat pasta without cream sauces.  Use skim or nonfat  milk, and eat low-fat or nonfat yogurt and cheeses.  Do not eat or drink whole milk, cream, ice cream, egg yolks, or hard cheeses.  Do not eat stick margarine or tub margarines that contain trans fats (also called partially hydrogenated oils).  Do not eat saturated tropical oils, such as coconut oil and palm oil.  Do not eat cakes, cookies, crackers, or other baked goods that contain trans fats.  General instructions  Exercise as directed by your health care provider. Increase your activity level with activities such as gardening, walking, and taking the stairs.  Take over-the-counter and prescription medicines only as told by your health care provider.  Do not use any products that contain nicotine or tobacco, such as cigarettes and e-cigarettes. If you need help quitting, ask your health care provider.  Keep all follow-up visits as told by your health care provider. This is important. Contact a health care provider if:  You are struggling to maintain a healthy diet or weight.  You need help to start on an exercise program.  You need help to stop smoking. Get help right away if:  You have chest pain.  You have trouble breathing. This information is not intended to replace advice given to you by your health care provider. Make sure you discuss any questions you have with your health care provider. Document Revised: 07/08/2017 Document Reviewed: 01/03/2016 Elsevier Patient Education  [place].  Cholesterol Content in Foods Cholesterol is a waxy, fat-like substance that helps to carry fat in the blood. The body needs cholesterol in small amounts, but too much cholesterol can cause damage to the arteries and heart. Most people should eat less than 200 milligrams (mg) of cholesterol a day. Foods with cholesterol  Cholesterol is found in animal-based foods, such as meat, seafood, and dairy. Generally, low-fat dairy and lean meats have less cholesterol than full-fat dairy  and fatty meats. The milligrams of cholesterol per serving (mg per serving) of common cholesterol-containing foods are listed below. Meat and other proteins  Egg -- one large whole egg has 186 mg.  Veal shank -- 4 oz has 141 mg.  Lean ground [place] (93% lean) -- 4 oz has 118 mg.  Fat-trimmed lamb loin -- 4 oz has 106 mg.  Lean ground beef (90% lean) -- 4 oz has 100 mg.  Lobster -- 3.5 oz has 90 mg.  Pork loin chops -- 4 oz has 86 mg.  Canned salmon -- 3.5 oz has 83 mg.  Fat-trimmed beef top loin -- 4 oz has 78 mg.  Frankfurter -- 1 frank (3.5 oz) has 77 mg.  Crab -- 3.5 oz has 71 mg.  Roasted chicken without skin, white meat -- 4 oz has 66 mg.  Light bologna -- 2 oz has 45 mg.  Deli-cut [place] -- 2 oz has 31 mg.  Canned tuna -- 3.5 oz has 31 mg.  Berniece Salines -- 1 oz has 29 mg.  Oysters and mussels (raw) -- 3.5 oz has 25 mg.  Mackerel -- 1 oz has 22 mg.  Trout -- 1 oz has 20 mg.  Pork sausage -- 1 link (1 oz) has 17 mg.  Salmon -- 1 oz has 16 mg.  Tilapia -- 1 oz has 14 mg. Dairy  Soft-serve ice cream --  cup (4 oz) has 103 mg.  Whole-milk yogurt -- 1 cup (8 oz) has 29 mg.  Cheddar cheese -- 1 oz has 28 mg.  American cheese -- 1 oz has 28 mg.  Whole milk -- 1 cup (8 oz) has 23 mg.  2% milk -- 1 cup (8 oz) has 18 mg.  Cream cheese -- 1 tablespoon (Tbsp) has 15 mg.  Cottage cheese --  cup (4 oz) has 14 mg.  Low-fat (1%) milk -- 1 cup (8 oz) has 10 mg.  Sour cream -- 1 Tbsp has 8.5 mg.  Low-fat yogurt -- 1 cup (8 oz) has 8 mg.  Nonfat Greek yogurt -- 1 cup (8 oz) has 7 mg.  Half-and-half cream -- 1 Tbsp has 5 mg. Fats and oils  Cod liver oil -- 1 tablespoon (Tbsp) has 82 mg.  Butter -- 1 Tbsp has 15 mg.  Lard -- 1 Tbsp has 14 mg.  Bacon grease -- 1 Tbsp has 14 mg.  Mayonnaise -- 1 Tbsp has 5-10 mg.  Margarine -- 1 Tbsp has 3-10 mg. Exact amounts of cholesterol in these foods may vary  depending on specific ingredients and brands. Foods  without cholesterol Most plant-based foods do not have cholesterol unless you combine them with a food that has cholesterol. Foods without cholesterol include:  Grains and cereals.  Vegetables.  Fruits.  Vegetable oils, such as olive, canola, and sunflower oil.  Legumes, such as peas, beans, and lentils.  Nuts and seeds.  Egg whites. Summary  The body needs cholesterol in small amounts, but too much cholesterol can cause damage to the arteries and heart.  Most people should eat less than 200 milligrams (mg) of cholesterol a day. This information is not intended to replace advice given to you by your health care provider. Make sure you discuss any questions you have with your health care provider. Document Revised: 06/17/2017 Document Reviewed: 03/01/2017 Elsevier Patient Education  [place].

## 2020-04-24 NOTE — Progress Notes (Signed)
Chief Complaint  Patient presents with   Annual Exam   Immunizations   Annual  1. Palpitations with pac/pvc sb, nsr and St at times cards wants her to have cardiac CT. Sob improved she will call cards to sch 2. Acne 08/21/20 appt Dr. Leonie Green for now acne doing well clindagel, spironlactone 50 mg qd, retina 0.025 and cyspera for dark spots 3. Migraines 1x per month was dizzy and given meclizine, prednisone which helped and disc magnesium 250 mg qd   Review of Systems  Constitutional: Negative for weight loss.  HENT: Negative for hearing loss.   Eyes: Negative for blurred vision.  Respiratory: Negative for shortness of breath.   Cardiovascular: Positive for palpitations. Negative for chest pain.  Gastrointestinal: Negative for abdominal pain.  Musculoskeletal: Negative for falls.  Skin: Negative for rash.  Neurological: Negative for headaches.  Psychiatric/Behavioral: Negative for depression.   Past Medical History:  Diagnosis Date   Acne vulgaris 11/21/2018   Blood in stool    Hyperlipidemia 04/19/2019   Lipoma of back    Vestibular neuritis    2010   Past Surgical History:  Procedure Laterality Date   LIPOMA EXCISION Left 12/21/2018   Procedure: EXCISION LIPOMA OF BACK;  Surgeon: Wallace Going, DO;  Location: [facility];  Service: Plastics;  Laterality: Left;   Family History  Problem Relation Age of Onset   Diabetes Mother    Hyperlipidemia Mother    Hypertension Mother    Hyperlipidemia Father    Diabetes Maternal Uncle    Hyperlipidemia Maternal Grandmother    Cancer Maternal Grandfather        lung smoker   Cancer Paternal Grandfather        colon   Diabetes Maternal Uncle    Social History   Socioeconomic History   Marital status: Unknown    Spouse name: Not on file   Number of children: Not on file   Years of education: Not on file   Highest education level: Not on file  Occupational History   Not on file   Tobacco Use   Smoking status: Never Smoker   Smokeless tobacco: Never Used  Substance and Sexual Activity   Alcohol use: Yes    Comment: occasionally   Drug use: No   Sexual activity: Yes    Birth control/protection: I.U.D.  Other Topics Concern   Not on file  Social History Narrative   RN supervisor [employer]   Married    1 son    Owns guns, wears seatbelt, safe in relationship    Social Determinants of Health   Financial Resource Strain:    Difficulty of Paying Living Expenses: Not on file  Food Insecurity:    Worried About Charity fundraiser in the Last Year: Not on file   [employer] of Food in the Last Year: Not on file  Transportation Needs:    Lack of Transportation (Medical): Not on file   Lack of Transportation (Non-Medical): Not on file  Physical Activity:    Days of Exercise per Week: Not on file   Minutes of Exercise per Session: Not on file  Stress:    Feeling of Stress : Not on file  Social Connections:    Frequency of Communication with Friends and Family: Not on file   Frequency of Social Gatherings with Friends and Family: Not on file   Attends Religious Services: Not on file   Active Member of Clubs or  Organizations: Not on file   Attends Archivist Meetings: Not on file   Marital Status: Not on file  Intimate Partner Violence:    Fear of Current or Ex-Partner: Not on file   Emotionally Abused: Not on file   Physically Abused: Not on file   Sexually Abused: Not on file   Current Meds  Medication Sig   clindamycin (CLINDAGEL) 1 % gel Apply topically 2 (two) times daily.   Cyanocobalamin (VITAMIN B-12 PO) Take 3,000 mcg by mouth daily.   levonorgestrel (MIRENA) 20 MCG/24HR IUD 1 each by Intrauterine route once.   meclizine (ANTIVERT) 25 MG tablet Take 1 tablet (25 mg total) by mouth 3 (three) times daily as needed for dizziness.   Multiple Vitamin (MULTIVITAMIN) tablet Take 1 tablet by mouth  daily.   NON FORMULARY cyspera bleaching cream qhs   Red Yeast Rice 600 MG TABS Take 1 tablet (600 mg total) by mouth daily.   spironolactone (ALDACTONE) 25 MG tablet Take 50 mg by mouth daily.   SUMAtriptan (IMITREX) 50 MG tablet Take 1 tablet (50 mg total) by mouth every 2 (two) hours as needed for migraine. May repeat in 2 hours if headache persists or recurs.   tretinoin (RETIN-A) 0.025 % cream Apply topically at bedtime. qhs   valACYclovir (VALTREX) 500 MG tablet Take 500 mg by mouth daily as needed.   [DISCONTINUED] predniSONE (DELTASONE) 20 MG tablet Take 1 tablet (20 mg total) by mouth daily with breakfast.   [DISCONTINUED] SUMAtriptan (IMITREX) 50 MG tablet Take 1 tablet (50 mg total) by mouth every 2 (two) hours as needed for migraine. May repeat in 2 hours if headache persists or recurs.   No Known Allergies Recent Results (from the past 2160 hour(s))  ECHOCARDIOGRAM COMPLETE     Status: None   Collection Time: 02/04/20  4:41 PM  Result Value Ref Range   Area-P 1/2 3.65 cm2   S' Lateral 2.50 cm   Objective  Body mass index is 22.96 kg/m. Wt Readings from Last 3 Encounters:  04/24/20 115 lb 6.4 oz (52.3 kg)  03/25/20 115 lb 6.4 oz (52.3 kg)  02/05/20 115 lb (52.2 kg)   Temp Readings from Last 3 Encounters:  04/24/20 (!) 97.2 F (36.2 C) (Temporal)  01/03/20 97.9 F (36.6 C) (Temporal)  06/21/19 (!) 97.4 F (36.3 C) (Temporal)   BP Readings from Last 3 Encounters:  04/24/20 124/80  03/25/20 116/64  01/09/20 116/76   Pulse Readings from Last 3 Encounters:  04/24/20 69  03/25/20 72  01/09/20 74    Physical Exam Vitals and nursing note reviewed.  Constitutional:      Appearance: Normal appearance. She is well-developed and well-groomed.  HENT:     Head: Normocephalic and atraumatic.  Eyes:     Conjunctiva/sclera: Conjunctivae normal.     Pupils: Pupils are equal, round, and reactive to light.  Cardiovascular:     Rate and Rhythm: Normal rate and  regular rhythm.     Heart sounds: Normal heart sounds. No murmur heard.   Pulmonary:     Effort: Pulmonary effort is normal.     Breath sounds: Normal breath sounds.  Abdominal:     General: Abdomen is flat. Bowel sounds are normal.     Tenderness: There is no abdominal tenderness.  Skin:    General: Skin is warm and dry.  Neurological:     General: No focal deficit present.     Mental Status: She is alert and oriented  to person, place, and time. Mental status is at baseline.     Gait: Gait normal.  Psychiatric:        Attention and Perception: Attention and perception normal.        Mood and Affect: Mood and affect normal.        Speech: Speech normal.        Behavior: Behavior normal. Behavior is cooperative.        Thought Content: Thought content normal.        Cognition and Memory: Cognition and memory normal.        Judgment: Judgment normal.     Assessment  Plan  Annual physical exam Needs flu shot - Plan: Flu Vaccine QUAD 6+ mos PF IM (Fluarix Quad PF)  flu shot given 04/24/20 Tdap 11/13/16  covid 2/2 booster 6-8 months  Pap [facility] Dr. Melba Coon no HPV 02/19/2019 -h/o abnormal pap 2006 s/p colp with nl paps since  New IUD2/2021 Colonoscopy 11/17/15-mild [facility] Dr. Paulita Fujita  Hep C pending, declines hiv rec healthy diet and exercise Vitamin D3 5000 1x per week  Dr. Sheran Fava dental  Intractable migraine without aura and with status migrainosus - Plan: SUMAtriptan (IMITREX) 50 MG tablet Magnesium 250 mg qd  Getting 1x per month  B12 deficiency - Plan: Vitamin B12  Hyperlipidemia, unspecified hyperlipidemia type - Plan: Lipid panel, Basic Metabolic Panel (BMET), CBC with Differential/Platelet, Urinalysis, Routine w reflex microscopic Given info  Disc cholestoff vs red yeast rice   Vitamin D deficiency - Plan: Vitamin D (25 hydroxy)  Palpitations PAC (premature atrial contraction) PVC's (premature ventricular contractions)  rec call cards  and sch CT cardiac   Provider: Dr. Olivia Mackie Mc[facility]

## 2020-04-25 LAB — URINALYSIS, ROUTINE W REFLEX MICROSCOPIC
Bilirubin Urine: NEGATIVE
Glucose, UA: NEGATIVE
Hgb urine dipstick: NEGATIVE
Leukocytes,Ua: NEGATIVE
Nitrite: NEGATIVE
Protein, ur: NEGATIVE
Specific Gravity, Urine: 1.028 (ref 1.001–1.03)
pH: <=5 (ref 5.0–8.0)

## 2020-05-04 DIAGNOSIS — F4323 Adjustment disorder with mixed anxiety and depressed mood: Secondary | ICD-10-CM | POA: Diagnosis not present

## 2020-05-05 ENCOUNTER — Other Ambulatory Visit: Payer: 59

## 2020-05-05 DIAGNOSIS — Z20822 Contact with and (suspected) exposure to covid-19: Secondary | ICD-10-CM

## 2020-05-06 LAB — NOVEL CORONAVIRUS, NAA: SARS-CoV-2, NAA: NOT DETECTED

## 2020-05-06 LAB — SARS-COV-2, NAA 2 DAY TAT

## 2020-05-08 ENCOUNTER — Other Ambulatory Visit: Payer: 59

## 2020-05-11 DIAGNOSIS — F4323 Adjustment disorder with mixed anxiety and depressed mood: Secondary | ICD-10-CM | POA: Diagnosis not present

## 2020-05-30 MED FILL — SPIRONOLACTONE 25 MG TABS: 25 | 30 days supply | Qty: 60 | Fill #6

## 2020-05-31 ENCOUNTER — Ambulatory Visit: Payer: 59 | Attending: Internal Medicine

## 2020-05-31 DIAGNOSIS — Z23 Encounter for immunization: Secondary | ICD-10-CM

## 2020-05-31 NOTE — Progress Notes (Signed)
   Covid-19 Vaccination Clinic  Name:  Annette Gutierrez    MRN: [id number] DOB: 1980-11-28  05/31/2020  [name] was observed post Covid-19 immunization for 15 minutes without incident. She was provided with Vaccine Information Sheet and instruction to access the V-Safe system.   [name] was instructed to call 911 with any severe reactions post vaccine: Marland Kitchen Difficulty breathing  . Swelling of face and throat  . A fast heartbeat  . A bad rash all over body  . Dizziness and weakness   Immunizations Administered    No immunizations on file.

## 2020-06-01 DIAGNOSIS — F4323 Adjustment disorder with mixed anxiety and depressed mood: Secondary | ICD-10-CM | POA: Diagnosis not present

## 2020-06-04 ENCOUNTER — Telehealth (HOSPITAL_COMMUNITY): Payer: Self-pay | Admitting: *Deleted

## 2020-06-04 NOTE — Telephone Encounter (Signed)
Pt returning call regarding upcoming cardiac imaging study; pt verbalizes understanding of appt date/time, parking situation and where to check in, pre-test NPO status and medications ordered, and verified current allergies; name and call back number provided for further questions should they arise  Demontre Padin Tai RN Navigator Cardiac Imaging [facility] [phone] office [phone] cell  

## 2020-06-04 NOTE — Telephone Encounter (Signed)
Attempted to call patient regarding upcoming cardiac CT appointment. °Left message on voicemail with name and callback number ° °Anuar Walgren Tai RN Navigator Cardiac Imaging °[facility] °[phone] Office °[phone] Cell ° °

## 2020-06-05 ENCOUNTER — Ambulatory Visit (HOSPITAL_COMMUNITY)
Admission: RE | Admit: 2020-06-05 | Discharge: 2020-06-05 | Disposition: A | Discharge status: Home / Self Care | Payer: 59 | Source: Ambulatory Visit | Attending: Cardiology | Admitting: Cardiology

## 2020-06-05 ENCOUNTER — Other Ambulatory Visit: Payer: Self-pay

## 2020-06-05 DIAGNOSIS — E785 Hyperlipidemia, unspecified: Secondary | ICD-10-CM | POA: Diagnosis not present

## 2020-06-05 DIAGNOSIS — R0602 Shortness of breath: Secondary | ICD-10-CM | POA: Diagnosis not present

## 2020-06-05 DIAGNOSIS — R0609 Other forms of dyspnea: Secondary | ICD-10-CM

## 2020-06-05 DIAGNOSIS — R0600 Dyspnea, unspecified: Secondary | ICD-10-CM | POA: Diagnosis not present

## 2020-06-05 DIAGNOSIS — E7800 Pure hypercholesterolemia, unspecified: Secondary | ICD-10-CM | POA: Diagnosis not present

## 2020-06-05 MED ORDER — NITROGLYCERIN 0.4 MG SL SUBL
SUBLINGUAL_TABLET | SUBLINGUAL | Status: AC
  Filled 2020-06-05: qty 2

## 2020-06-05 MED ORDER — NITROGLYCERIN 0.4 MG SL SUBL
0.8000 mg | SUBLINGUAL_TABLET | Freq: Once | SUBLINGUAL | Status: AC
  Administered 2020-06-05: 0.8 mg via SUBLINGUAL

## 2020-06-05 MED ORDER — IOHEXOL 350 MG/ML SOLN
80.0000 mL | Freq: Once | INTRAVENOUS | Status: AC | PRN
  Administered 2020-06-05: 80 mL via INTRAVENOUS

## 2020-06-08 DIAGNOSIS — F4323 Adjustment disorder with mixed anxiety and depressed mood: Secondary | ICD-10-CM | POA: Diagnosis not present

## 2020-06-11 ENCOUNTER — Encounter: Payer: Self-pay | Admitting: *Deleted

## 2020-06-29 DIAGNOSIS — F4323 Adjustment disorder with mixed anxiety and depressed mood: Secondary | ICD-10-CM | POA: Diagnosis not present

## 2020-08-10 DIAGNOSIS — F4323 Adjustment disorder with mixed anxiety and depressed mood: Secondary | ICD-10-CM | POA: Diagnosis not present

## 2020-08-21 DIAGNOSIS — L700 Acne vulgaris: Secondary | ICD-10-CM | POA: Diagnosis not present

## 2020-08-24 DIAGNOSIS — F4323 Adjustment disorder with mixed anxiety and depressed mood: Secondary | ICD-10-CM | POA: Diagnosis not present

## 2020-09-15 ENCOUNTER — Other Ambulatory Visit: Payer: Self-pay | Admitting: Internal Medicine

## 2020-09-15 ENCOUNTER — Encounter: Payer: Self-pay | Admitting: Internal Medicine

## 2020-09-15 DIAGNOSIS — L700 Acne vulgaris: Secondary | ICD-10-CM

## 2020-09-15 MED ORDER — CLINDAMYCIN PHOSPHATE 1 % EX GEL: Freq: Two times a day (BID) | CUTANEOUS | 11 refills | Status: DC

## 2020-09-15 MED ORDER — SPIRONOLACTONE 50 MG PO TABS: 50.0000 mg | ORAL_TABLET | Freq: Every day | ORAL | 3 refills | Status: DC

## 2020-09-15 MED ORDER — TRETINOIN 0.1 % EX CREA: TOPICAL_CREAM | Freq: Every day | CUTANEOUS | 11 refills | Status: DC

## 2020-09-17 ENCOUNTER — Telehealth: Payer: Self-pay | Admitting: Internal Medicine

## 2020-09-17 NOTE — Telephone Encounter (Signed)
Prior authorization has been submitted for patient's Tretinoin 0.1% cream  Awaiting approval or denial.

## 2020-09-22 ENCOUNTER — Ambulatory Visit: Payer: 59 | Admitting: Cardiology

## 2020-10-05 DIAGNOSIS — F4323 Adjustment disorder with mixed anxiety and depressed mood: Secondary | ICD-10-CM | POA: Diagnosis not present

## 2020-10-19 DIAGNOSIS — F4323 Adjustment disorder with mixed anxiety and depressed mood: Secondary | ICD-10-CM | POA: Diagnosis not present

## 2020-11-16 DIAGNOSIS — F4323 Adjustment disorder with mixed anxiety and depressed mood: Secondary | ICD-10-CM | POA: Diagnosis not present

## 2020-11-25 DIAGNOSIS — F4323 Adjustment disorder with mixed anxiety and depressed mood: Secondary | ICD-10-CM | POA: Diagnosis not present

## 2020-12-07 DIAGNOSIS — F4323 Adjustment disorder with mixed anxiety and depressed mood: Secondary | ICD-10-CM | POA: Diagnosis not present

## 2020-12-10 ENCOUNTER — Encounter: Payer: Self-pay | Admitting: Internal Medicine

## 2020-12-11 ENCOUNTER — Other Ambulatory Visit: Payer: Self-pay

## 2020-12-11 ENCOUNTER — Telehealth (INDEPENDENT_AMBULATORY_CARE_PROVIDER_SITE_OTHER): Payer: 59 | Admitting: Internal Medicine

## 2020-12-11 ENCOUNTER — Encounter: Payer: Self-pay | Admitting: Internal Medicine

## 2020-12-11 VITALS — Ht 59.45 in | Wt 120.0 lb

## 2020-12-11 DIAGNOSIS — U071 COVID-19: Secondary | ICD-10-CM | POA: Diagnosis not present

## 2020-12-11 DIAGNOSIS — J40 Bronchitis, not specified as acute or chronic: Secondary | ICD-10-CM | POA: Diagnosis not present

## 2020-12-11 MED ORDER — AZITHROMYCIN 250 MG PO TABS: ORAL_TABLET | ORAL | 0 refills | Status: AC

## 2020-12-11 MED ORDER — PREDNISONE 20 MG PO TABS: 40.0000 mg | ORAL_TABLET | Freq: Every day | ORAL | 0 refills | Status: DC

## 2020-12-11 NOTE — Progress Notes (Signed)
Virtual Visit via Video Note  I connected with Annette Gutierrez  on 12/11/20 at  3:20 PM EDT by a video enabled telemedicine application and verified that I am speaking with the correct person using two identifiers.  Location patient: home, [place] Location provider:work or home office Persons participating in the virtual visit: patient, provider  I discussed the limitations of evaluation and management by telemedicine and the availability of in person appointments. The patient expressed understanding and agreed to proceed.   HPI: Sick visit c/o fatigue this week and had a lunch at work and it did not taste right son had covid+ Monday and Tuesday had lunch at work did not taste right and tested negative covid called [facility] and did home covid test 12/10/20 + covid with chills, cough, congestion, headache, rnny nose, scratchy throat, nasal congestion, denies fever tried vitamin C, zinc, tylenol , vitamin B12, mvt at home. Also has mucinex with Tylenol and decongestant and AH and asal saline and flonse O2 sat 97-8%   -COVID-19 vaccine status: 3/3 covid vaccines  ROS: See pertinent positives and negatives per HPI.  Past Medical History:  Diagnosis Date  . Acne vulgaris 11/21/2018  . Blood in stool   . Hyperlipidemia 04/19/2019  . Lipoma of back   . Vestibular neuritis    2010    Past Surgical History:  Procedure Laterality Date  . LIPOMA EXCISION Left 12/21/2018   Procedure: EXCISION LIPOMA OF BACK;  Surgeon: Wallace Going, DO;  Location: [place];  Service: Plastics;  Laterality: Left;     Current Outpatient Medications:  .  azithromycin (ZITHROMAX) 250 MG tablet, Take 2 tablets on day 1, then 1 tablet daily on days 2 through 5, Disp: 6 tablet, Rfl: 0 .  clindamycin (CLINDAGEL) 1 % gel, Apply topically 2 (two) times daily., Disp: 30 g, Rfl: 11 .  clindamycin (CLINDAGEL) 1 % gel, APPLY TO THE AFFECTED AREA(S) 2 TIMES DAILY., Disp: 30 g, Rfl: 11 .  Cyanocobalamin (VITAMIN B-12  PO), Take 3,000 mcg by mouth daily., Disp: , Rfl:  .  levonorgestrel (MIRENA) 20 MCG/24HR IUD, 1 each by Intrauterine route once., Disp: , Rfl:  .  meclizine (ANTIVERT) 25 MG tablet, Take 1 tablet (25 mg total) by mouth 3 (three) times daily as needed for dizziness., Disp: 30 tablet, Rfl: 0 .  Multiple Vitamin (MULTIVITAMIN) tablet, Take 1 tablet by mouth daily., Disp: , Rfl:  .  NON FORMULARY, cyspera bleaching cream qhs, Disp: , Rfl:  .  predniSONE (DELTASONE) 20 MG tablet, Take 2 tablets (40 mg total) by mouth daily with breakfast. x5-10 days, Disp: 20 tablet, Rfl: 0 .  spironolactone (ALDACTONE) 50 MG tablet, TAKE 1 TABLET BY MOUTH ONCE DAILY EVERY MORNING, Disp: 90 tablet, Rfl: 3 .  SUMAtriptan (IMITREX) 50 MG tablet, Take 1 tablet (50 mg total) by mouth every 2 (two) hours as needed for migraine. May repeat in 2 hours if headache persists or recurs., Disp: 10 tablet, Rfl: 2 .  tretinoin (RETIN-A) 0.1 % cream, APPLY TOPICALLY AT BEDTIME WITH MOISTURIZER, Disp: 45 g, Rfl: 11 .  valACYclovir (VALTREX) 500 MG tablet, Take 500 mg by mouth daily as needed., Disp: , Rfl:  .  Red Yeast Rice 600 MG TABS, Take 1 tablet (600 mg total) by mouth daily., Disp: 90 tablet, Rfl: 1 .  spironolactone (ALDACTONE) 25 MG tablet, TAKE 1 TABLET BY MOUTH IN THE MORNING FOR 2 WEEKS THEN INCREASE TO 2 TABLETS IF NEEDED IN THE MORNING (  Patient not taking: Reported on 12/11/2020), Disp: 60 tablet, Rfl: 4 .  spironolactone (ALDACTONE) 50 MG tablet, Take 1 tablet (50 mg total) by mouth daily. In the am (Patient not taking: Reported on 12/11/2020), Disp: 90 tablet, Rfl: 3 .  tretinoin (RETIN-A) 0.1 % cream, Apply topically at bedtime. With moisturizer (Patient not taking: Reported on 12/11/2020), Disp: 45 g, Rfl: 11  EXAM:  VITALS per patient if applicable:  GENERAL: alert, oriented, appears well and in no acute distress  HEENT: atraumatic, conjunttiva clear, no obvious abnormalities on inspection of external nose and  ears  NECK: normal movements of the head and neck  LUNGS: on inspection no signs of respiratory distress, breathing rate appears normal, no obvious gross SOB, gasping or wheezing  CV: no obvious cyanosis  MS: moves all visible extremities without noticeable abnormality  PSYCH/NEURO: pleasant and cooperative, no obvious depression or anxiety, speech and thought processing grossly intact  ASSESSMENT AND PLAN:  Discussed the following assessment and plan:  COVID-19 - Plan: azithromycin (ZITHROMAX) 250 MG tablet, predniSONE (DELTASONE) 20 MG tablet  Bronchitis due to COVID-19 virus - Plan: azithromycin (ZITHROMAX) 250 MG tablet, predniSONE (DELTASONE) 20 MG tablet Declines covid 19 pill  Note for work to return 12/22/20  Quarantine    If needing prescription strength medication we will need to make an appointment with a provider.  These are over the counter medication options:  Mucinex dm green label for cough.  Vitamin C 1000 mg daily.  Vitamin D3 4000 Iu (units) daily.  Zinc 100 mg daily.  Quercetin 250-500 mg 2 times per day   Elderberry  Oil of oregano  cepacol or chloroseptic spray  Warm tea with honey and lemon  Hydration  Try to eat though you dont feel like it   Tylenol or Advil  Nasal saline  Flonase     Monitor pulse oximeter, buy from [place] if oxygen is less than 90 please go to the hospital.        Are you feeling really sick? Shortness of breath, cough, chest pain?, dizziness? Confusion   If so let me know  If worsening, go to hospital or [facility] clinic Urgent care for further treatment.     -we discussed possible serious and likely etiologies, options for evaluation and workup, limitations of telemedicine visit vs in person visit, treatment, treatment risks and precautions. Pt prefers to treat via telemedicine empirically rather than in person at this moment.      I discussed the assessment and treatment plan with the patient. The patient was provided an  opportunity to ask questions and all were answered. The patient agreed with the plan and demonstrated an understanding of the instructions.    Time spent 20 min Delorise Jackson, MD

## 2020-12-11 NOTE — Progress Notes (Signed)
Patient tested positive for COVID 12/10/20.   Patient's son tested positive Sunday. PCR test Monday was negative. Home test last night positive.   Cough, nasal congestion and fatigue

## 2020-12-11 NOTE — Patient Instructions (Signed)
If needing prescription strength medication we will need to make an appointment with a provider.  over the counter medication options:  Mucinex dm green label for cough or robitussin DM  Vitamin C 1000 mg daily.  Vitamin D3 4000 Iu (units) daily.  Zinc 100 mg daily.  Quercetin 250-500 mg 2 times per day   Elderberry  Oil of oregano  cepacol or chloroseptic spray sore throat  Warm salt gargles  Warm tea with honey and lemon  Hydration  Try to eat though you dont feel like it   Tylenol or Advil as needed fever body aches Nasal saline 2 sprays followed by Flonase 2 sprays  If runny nose sneezing antihistamine allegra, zyrtec, claritin, xyzal    Monitor pulse oximeter, buy from [place] if oxygen is less than 90 please go to the hospital.  Are you feeling really sick? Shortness of breath, cough, chest pain?, dizziness? Confusion   If so let me know  If worsening, go to hospital or [facility] clinic Urgent care for further treatment.

## 2020-12-21 DIAGNOSIS — F4323 Adjustment disorder with mixed anxiety and depressed mood: Secondary | ICD-10-CM | POA: Diagnosis not present

## 2021-01-11 DIAGNOSIS — F4323 Adjustment disorder with mixed anxiety and depressed mood: Secondary | ICD-10-CM | POA: Diagnosis not present

## 2021-02-09 ENCOUNTER — Other Ambulatory Visit: Payer: Self-pay

## 2021-02-09 MED FILL — Spironolactone Tab 50 MG: ORAL | 90 days supply | Qty: 90 | Fill #0 | Status: AC

## 2021-02-10 DIAGNOSIS — F4323 Adjustment disorder with mixed anxiety and depressed mood: Secondary | ICD-10-CM | POA: Diagnosis not present

## 2021-02-22 DIAGNOSIS — F4323 Adjustment disorder with mixed anxiety and depressed mood: Secondary | ICD-10-CM | POA: Diagnosis not present

## 2021-03-11 DIAGNOSIS — H5213 Myopia, bilateral: Secondary | ICD-10-CM | POA: Diagnosis not present

## 2021-03-29 DIAGNOSIS — F4323 Adjustment disorder with mixed anxiety and depressed mood: Secondary | ICD-10-CM | POA: Diagnosis not present

## 2021-05-11 DIAGNOSIS — F4323 Adjustment disorder with mixed anxiety and depressed mood: Secondary | ICD-10-CM | POA: Diagnosis not present

## 2021-05-18 DIAGNOSIS — Z113 Encounter for screening for infections with a predominantly sexual mode of transmission: Secondary | ICD-10-CM | POA: Diagnosis not present

## 2021-05-18 DIAGNOSIS — Z01419 Encounter for gynecological examination (general) (routine) without abnormal findings: Secondary | ICD-10-CM | POA: Diagnosis not present

## 2021-05-25 DIAGNOSIS — F4323 Adjustment disorder with mixed anxiety and depressed mood: Secondary | ICD-10-CM | POA: Diagnosis not present

## 2021-06-17 ENCOUNTER — Ambulatory Visit (INDEPENDENT_AMBULATORY_CARE_PROVIDER_SITE_OTHER): Payer: 59 | Admitting: Internal Medicine

## 2021-06-17 ENCOUNTER — Encounter: Payer: Self-pay | Admitting: Internal Medicine

## 2021-06-17 ENCOUNTER — Other Ambulatory Visit: Payer: Self-pay

## 2021-06-17 ENCOUNTER — Ambulatory Visit: Payer: 59 | Admitting: Internal Medicine

## 2021-06-17 VITALS — BP 118/74 | HR 79 | Temp 97.5°F | Ht 58.86 in | Wt 126.0 lb

## 2021-06-17 DIAGNOSIS — Z0000 Encounter for general adult medical examination without abnormal findings: Secondary | ICD-10-CM

## 2021-06-17 DIAGNOSIS — G43011 Migraine without aura, intractable, with status migrainosus: Secondary | ICD-10-CM

## 2021-06-17 DIAGNOSIS — Z1329 Encounter for screening for other suspected endocrine disorder: Secondary | ICD-10-CM | POA: Diagnosis not present

## 2021-06-17 DIAGNOSIS — Z1389 Encounter for screening for other disorder: Secondary | ICD-10-CM

## 2021-06-17 DIAGNOSIS — L700 Acne vulgaris: Secondary | ICD-10-CM | POA: Diagnosis not present

## 2021-06-17 DIAGNOSIS — E559 Vitamin D deficiency, unspecified: Secondary | ICD-10-CM

## 2021-06-17 DIAGNOSIS — Z13220 Encounter for screening for lipoid disorders: Secondary | ICD-10-CM

## 2021-06-17 LAB — CBC WITH DIFFERENTIAL/PLATELET
Basophils Absolute: 0 10*3/uL (ref 0.0–0.1)
Basophils Relative: 0.8 % (ref 0.0–3.0)
Eosinophils Absolute: 0.1 10*3/uL (ref 0.0–0.7)
Eosinophils Relative: 1.1 % (ref 0.0–5.0)
HCT: 40.4 % (ref 36.0–46.0)
Hemoglobin: 13.5 g/dL (ref 12.0–15.0)
Lymphocytes Relative: 34.9 % (ref 12.0–46.0)
Lymphs Abs: 1.9 10*3/uL (ref 0.7–4.0)
MCHC: 33.4 g/dL (ref 30.0–36.0)
MCV: 84.9 fl (ref 78.0–100.0)
Monocytes Absolute: 0.4 10*3/uL (ref 0.1–1.0)
Monocytes Relative: 7.4 % (ref 3.0–12.0)
Neutro Abs: 3 10*3/uL (ref 1.4–7.7)
Neutrophils Relative %: 55.8 % (ref 43.0–77.0)
Platelets: 289 10*3/uL (ref 150.0–400.0)
RBC: 4.75 Mil/uL (ref 3.87–5.11)
RDW: 13.5 % (ref 11.5–15.5)
WBC: 5.3 10*3/uL (ref 4.0–10.5)

## 2021-06-17 LAB — LIPID PANEL
Cholesterol: 202 mg/dL — ABNORMAL HIGH (ref 0–200)
HDL: 56.4 mg/dL (ref >39.00)
LDL Cholesterol: 136 mg/dL — ABNORMAL HIGH (ref 0–99)
NonHDL: 146.08
Total CHOL/HDL Ratio: 4
Triglycerides: 50 mg/dL (ref 0.0–149.0)
VLDL: 10 mg/dL (ref 0.0–40.0)

## 2021-06-17 LAB — COMPREHENSIVE METABOLIC PANEL
ALT: 12 U/L (ref 0–35)
AST: 15 U/L (ref 0–37)
Albumin: 4.3 g/dL (ref 3.5–5.2)
Alkaline Phosphatase: 59 U/L (ref 39–117)
BUN: 14 mg/dL (ref 6–23)
CO2: 27 mEq/L (ref 19–32)
Calcium: 9.6 mg/dL (ref 8.4–10.5)
Chloride: 104 mEq/L (ref 96–112)
Creatinine, Ser: 0.83 mg/dL (ref 0.40–1.20)
GFR: 89.48 mL/min (ref >60.00)
Glucose, Bld: 83 mg/dL (ref 70–99)
Potassium: 4.4 mEq/L (ref 3.5–5.1)
Sodium: 136 mEq/L (ref 135–145)
Total Bilirubin: 0.5 mg/dL (ref 0.2–1.2)
Total Protein: 7 g/dL (ref 6.0–8.3)

## 2021-06-17 LAB — TSH: TSH: 1.84 u[IU]/mL (ref 0.35–5.50)

## 2021-06-17 LAB — VITAMIN D 25 HYDROXY (VIT D DEFICIENCY, FRACTURES): VITD: 54.6 ng/mL (ref 30.00–100.00)

## 2021-06-17 MED ORDER — SPIRONOLACTONE 50 MG PO TABS
ORAL_TABLET | Freq: Every morning | ORAL | 3 refills | Status: DC
  Filled 2021-06-17: qty 90, 90d supply, fill #0
  Filled 2022-01-25: qty 90, 90d supply, fill #1

## 2021-06-17 MED ORDER — TRETINOIN 0.1 % EX CREA
TOPICAL_CREAM | Freq: Every day | CUTANEOUS | 11 refills | Status: DC
  Filled 2021-06-17: qty 45, 30d supply, fill #0

## 2021-06-17 MED ORDER — SUMATRIPTAN SUCCINATE 50 MG PO TABS
50.0000 mg | ORAL_TABLET | ORAL | 11 refills | Status: DC | PRN
  Filled 2021-06-17: qty 9, 30d supply, fill #0

## 2021-06-17 MED ORDER — VALACYCLOVIR HCL 500 MG PO TABS
500.0000 mg | ORAL_TABLET | Freq: Every day | ORAL | 0 refills | Status: DC | PRN
  Filled 2021-06-17: qty 90, 75d supply, fill #0

## 2021-06-17 MED ORDER — CLINDAMYCIN PHOSPHATE 1 % EX SOLN
CUTANEOUS | 11 refills | Status: DC
  Filled 2021-06-17: qty 30, 30d supply, fill #0
  Filled 2021-06-17: qty 30, fill #0

## 2021-06-17 NOTE — Progress Notes (Signed)
Chief Complaint  Patient presents with   Annual Exam   Annual 1. Acne on clindagel and spironolactone 50 and tretinoin 0.1 cream doing well  2. Migraines not having refill imitrex in case needed   Review of Systems  Constitutional:  Negative for weight loss.  HENT:  Negative for hearing loss.   Eyes:  Negative for blurred vision.  Respiratory:  Negative for shortness of breath.   Cardiovascular:  Negative for chest pain.  Gastrointestinal:  Negative for abdominal pain and blood in stool.  Genitourinary:  Negative for dysuria.  Musculoskeletal:  Negative for falls and joint pain.  Skin:  Negative for rash.  Neurological:  Negative for headaches.  Psychiatric/Behavioral:  Negative for depression.   Past Medical History:  Diagnosis Date   Acne vulgaris 11/21/2018   Blood in stool    COVID-19    12/10/20   Hyperlipidemia 04/19/2019   Lipoma of back    Vestibular neuritis    2010   Past Surgical History:  Procedure Laterality Date   LIPOMA EXCISION Left 12/21/2018   Procedure: EXCISION LIPOMA OF BACK;  Surgeon: Wallace Going, DO;  Location: [place];  Service: Plastics;  Laterality: Left;   Family History  Problem Relation Age of Onset   Diabetes Mother    Hyperlipidemia Mother    Hypertension Mother    Hyperlipidemia Father    Diabetes Maternal Uncle    Hyperlipidemia Maternal Grandmother    Cancer Maternal Grandfather        lung smoker   Cancer Paternal Grandfather        colon   Diabetes Maternal Uncle    Social History   Socioeconomic History   Marital status: Unknown    Spouse name: Not on file   Number of children: Not on file   Years of education: Not on file   Highest education level: Not on file  Occupational History   Not on file  Tobacco Use   Smoking status: Never   Smokeless tobacco: Never  Substance and Sexual Activity   Alcohol use: Yes    Comment: occasionally   Drug use: No   Sexual activity: Yes    Birth  control/protection: I.U.D.  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   Magnesium 250 MG TABS Take 250 mg by mouth daily.   Multiple Vitamin (MULTIVITAMIN) tablet Take 1 tablet by mouth daily.   NON FORMULARY cyspera bleaching cream qhs   [DISCONTINUED] clindamycin (CLINDAGEL) 1 % gel APPLY TO THE AFFECTED AREA(S) 2 TIMES DAILY.   [DISCONTINUED] meclizine (ANTIVERT) 25 MG tablet Take 1 tablet (25 mg total) by mouth 3 (three) times daily as needed for dizziness.   [DISCONTINUED] spironolactone (ALDACTONE) 50 MG tablet TAKE 1 TABLET BY MOUTH ONCE DAILY EVERY MORNING   [DISCONTINUED] SUMAtriptan (IMITREX) 50 MG tablet Take  1 tablet (50 mg total) by mouth every 2 (two) hours as needed for migraine. May repeat in 2 hours if headache persists or recurs.   [DISCONTINUED] tretinoin (RETIN-A) 0.1 % cream Apply topically at bedtime. With moisturizer   [DISCONTINUED] valACYclovir (VALTREX) 500 MG tablet Take 500 mg by mouth daily as needed.   No Known Allergies No results found for this or any previous visit (from the past 2160 hour(s)). Objective  Body mass index is 25.57 kg/m. Wt Readings from Last 3 Encounters:  06/17/21 126 lb (57.2 kg)  12/11/20 120 lb (54.4 kg)  04/24/20 115 lb 6.4 oz (52.3 kg)   Temp Readings from Last 3 Encounters:  06/17/21 (!) 97.5 F (36.4 C) (Temporal)  04/24/20 (!) 97.2 F (36.2 C) (Temporal)  01/03/20 97.9 F (36.6 C)  (Temporal)   BP Readings from Last 3 Encounters:  06/17/21 118/74  06/05/20 96/65  04/24/20 124/80   Pulse Readings from Last 3 Encounters:  06/17/21 79  06/05/20 65  04/24/20 69    Physical Exam Vitals and nursing note reviewed.  Constitutional:      Appearance: Normal appearance. She is well-developed and well-groomed.  HENT:     Head: Normocephalic and atraumatic.  Eyes:     Conjunctiva/sclera: Conjunctivae normal.     Pupils: Pupils are equal, round, and reactive to light.  Cardiovascular:     Rate and Rhythm: Normal rate and regular rhythm.     Heart sounds: Normal heart sounds. No murmur heard. Pulmonary:     Effort: Pulmonary effort is normal.     Breath sounds: Normal breath sounds.  Abdominal:     General: Abdomen is flat. Bowel sounds are normal.     Tenderness: There is no abdominal tenderness.  Musculoskeletal:        General: No tenderness.  Skin:    General: Skin is warm and dry.  Neurological:     General: No focal deficit present.     Mental Status: She is alert and oriented to person, place, and time. Mental status is at baseline.     Cranial Nerves: Cranial nerves 2-12 are intact.     Gait: Gait is intact.  Psychiatric:        Attention and Perception: Attention and perception normal.        Mood and Affect: Mood and affect normal.        Speech: Speech normal.        Behavior: Behavior normal. Behavior is cooperative.        Thought Content: Thought content normal.        Cognition and Memory: Cognition and memory normal.        Judgment: Judgment normal.    Assessment  Plan  Annual physical exam - Plan: Comprehensive metabolic panel, Lipid panel, CBC with Differential/Platelet, TSH, Urinalysis, Routine w reflex microscopic, Vitamin D (25 hydroxy) See below   Acne vulgaris - Plan: tretinoin (RETIN-A) 0.1 % cream Clindamycin solution   Intractable migraine without aura and with status migrainosus - Plan: SUMAtriptan (IMITREX) 50 MG  tablet   HM Flu shot given utd Tdap 11/13/16  covid 2/2 booster 6-8 months Hep B immune   Pap [facility] Dr. Landry Mellow neg no HPV 02/19/2019  -h/o abnormal pap 2006 s/p colp with nl paps since  New IUD 08/2019 Colonoscopy 11/17/15-mild [facility] Dr. Paulita Fujita  Hep C, declines hiv  rec healthy diet and exercise  Vitamin D3 2000--4000 IU daily   Dr. Sheran Fava dental  Provider: Dr. Olivia Mackie McLean-Scocuz[facility]

## 2021-06-17 NOTE — Patient Instructions (Addendum)
Consider pfizer new vaccine ask pharmacist when to get 4th   D3 2000 to 4000 IU daily

## 2021-06-18 LAB — URINALYSIS, ROUTINE W REFLEX MICROSCOPIC
Bilirubin Urine: NEGATIVE
Glucose, UA: NEGATIVE
Hgb urine dipstick: NEGATIVE
Ketones, ur: NEGATIVE
Leukocytes,Ua: NEGATIVE
Nitrite: NEGATIVE
Protein, ur: NEGATIVE
Specific Gravity, Urine: 1.019 (ref 1.001–1.035)
pH: 6 (ref 5.0–8.0)

## 2021-07-27 DIAGNOSIS — F4323 Adjustment disorder with mixed anxiety and depressed mood: Secondary | ICD-10-CM | POA: Diagnosis not present

## 2021-08-05 IMAGING — DX DG CHEST 2V
2 series · 2 of 2 positions shown · non-contrast
Comparison: 10/03/2018

CLINICAL DATA: Shortness of breath

EXAM:
CHEST - 2 VIEW

[chest pa]
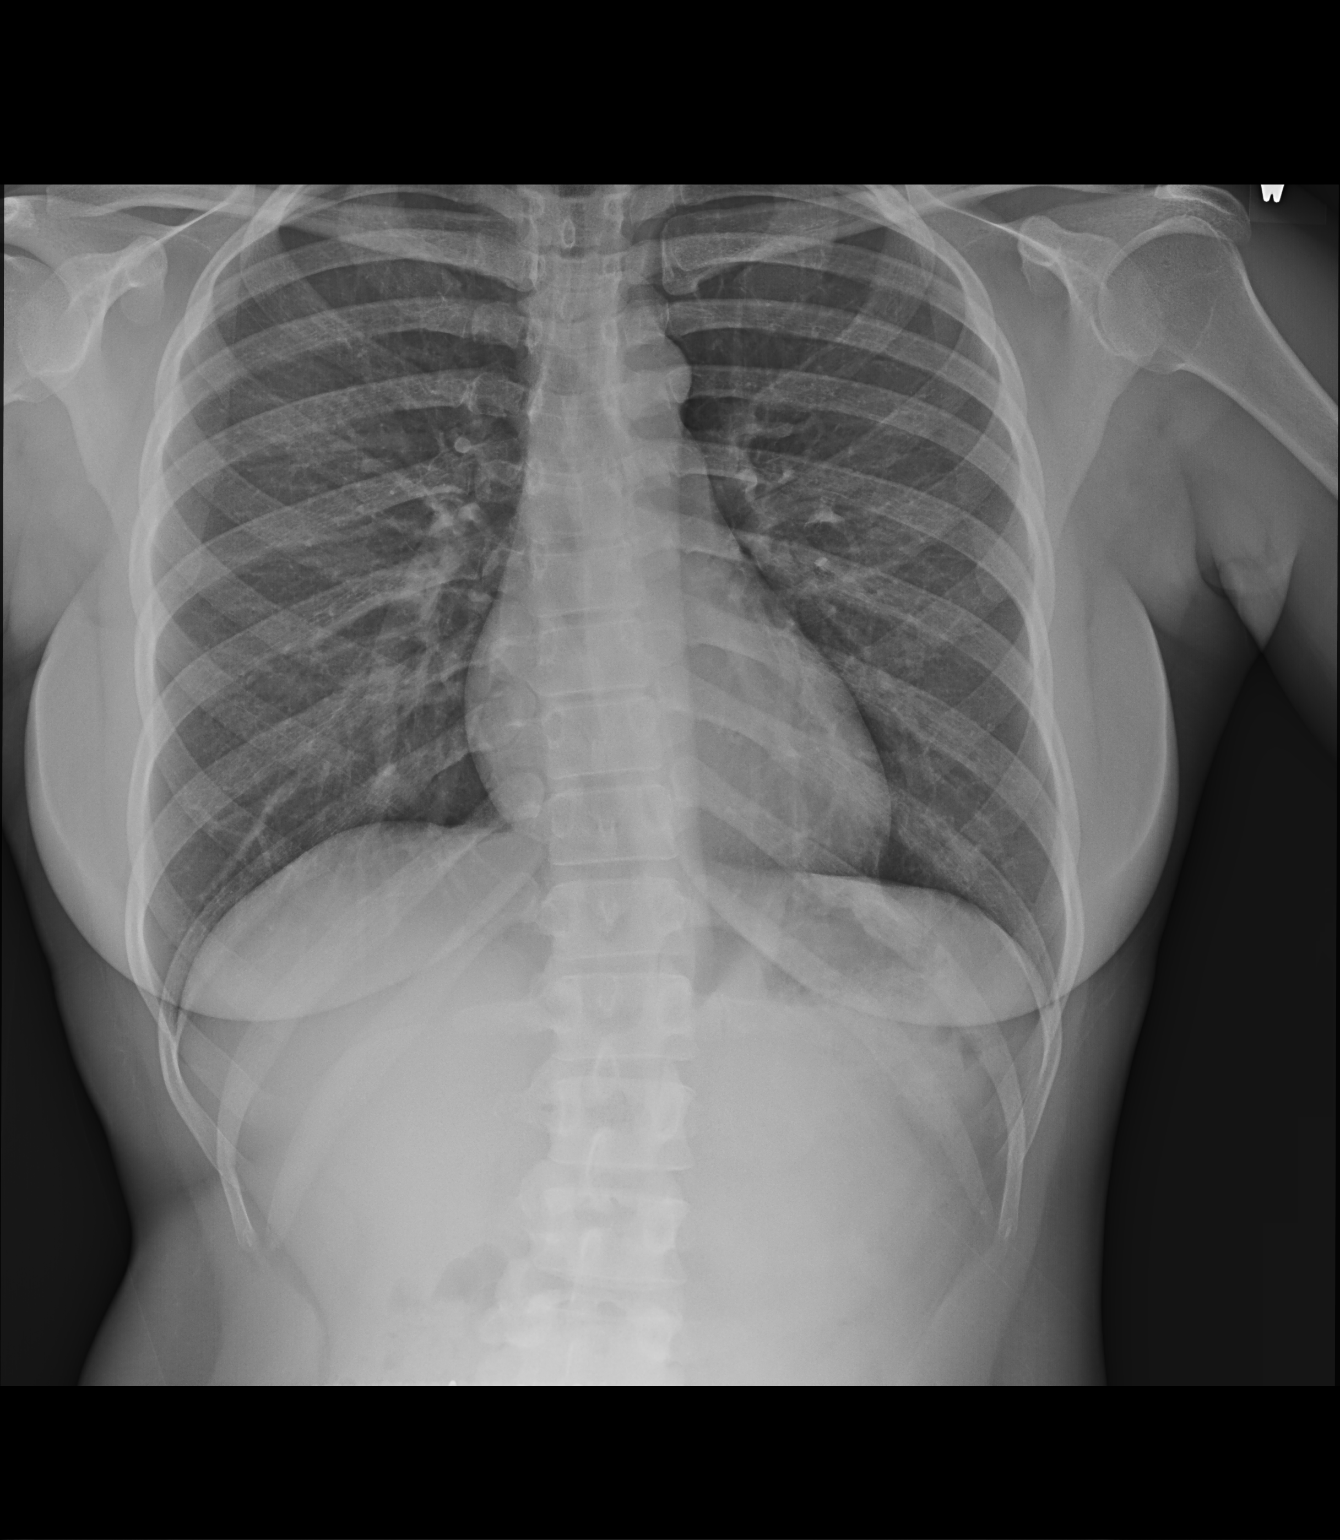

[chest lat]
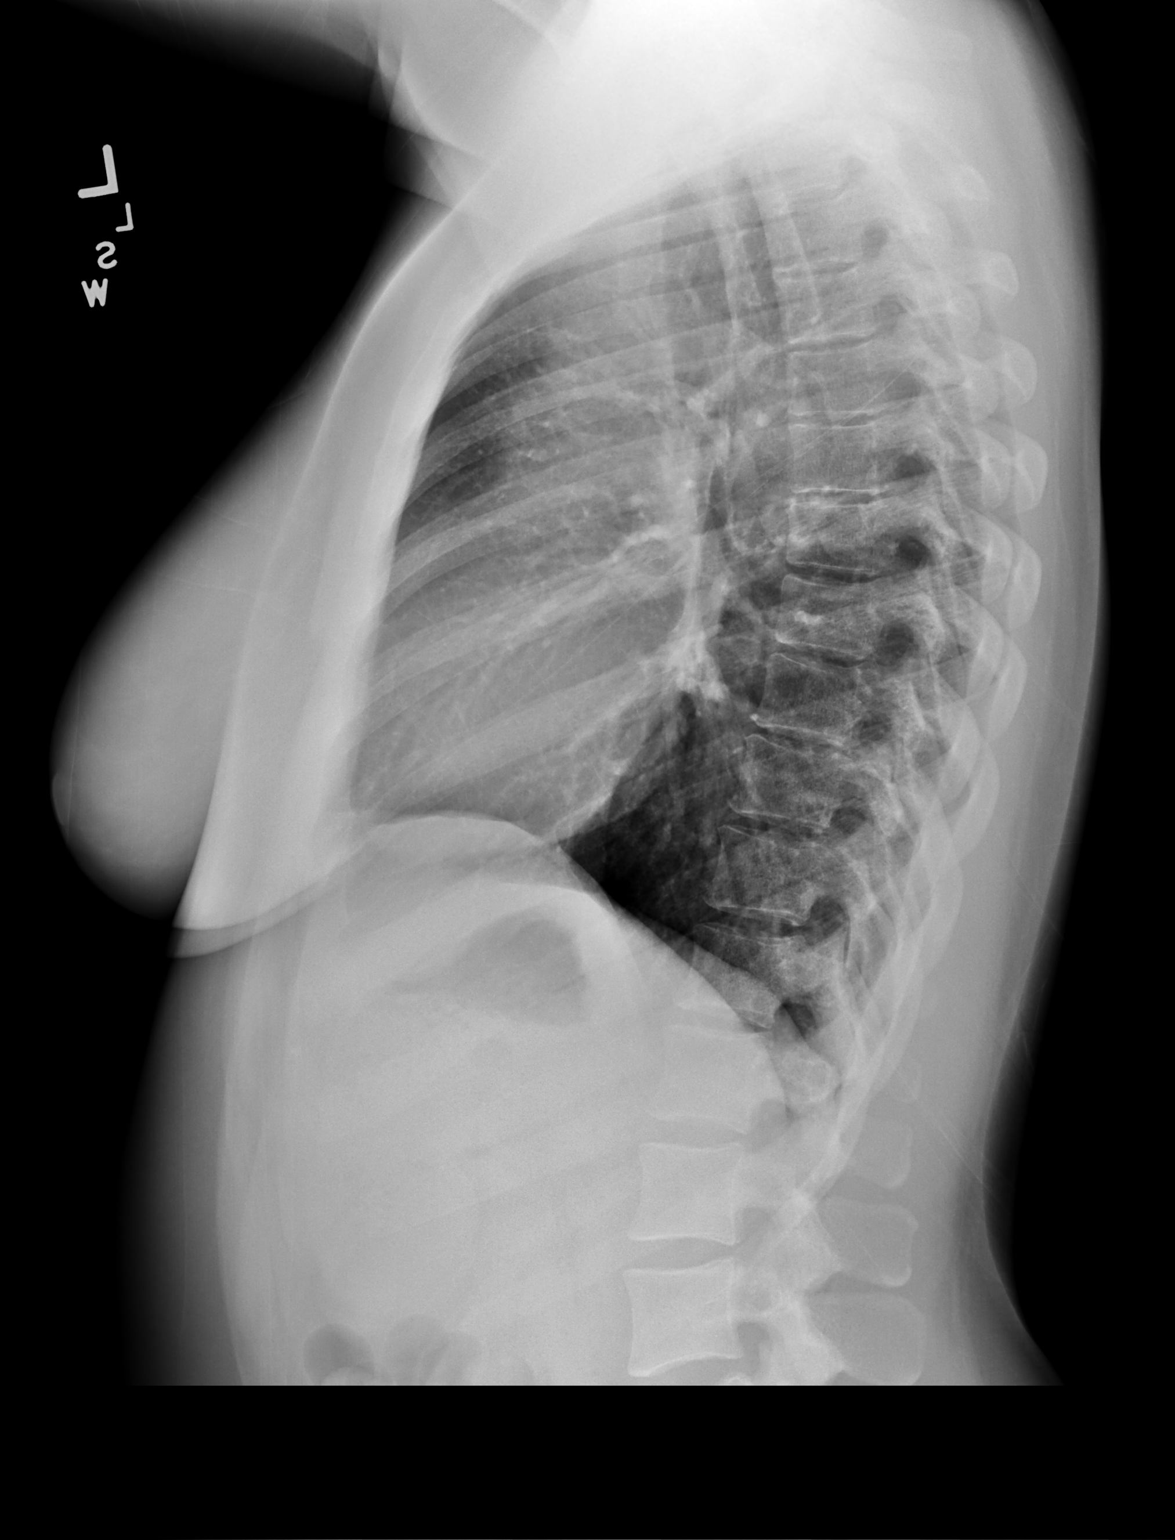

[2 of 2 positions shown; findings below may reference images not displayed]

FINDINGS: The heart size and mediastinal contours are within normal limits.
Both lungs are clear. The visualized skeletal structures are
unremarkable.
IMPRESSION: No active cardiopulmonary disease.

## 2021-08-18 ENCOUNTER — Other Ambulatory Visit: Payer: Self-pay | Admitting: Internal Medicine

## 2021-08-18 ENCOUNTER — Other Ambulatory Visit: Payer: Self-pay

## 2021-08-19 ENCOUNTER — Other Ambulatory Visit: Payer: Self-pay

## 2021-08-19 MED ORDER — CLINDAMYCIN PHOSPHATE 1 % EX SOLN
CUTANEOUS | 11 refills | Status: DC
  Filled 2021-08-19 – 2021-08-20 (×2): qty 30, 30d supply, fill #0
  Filled 2022-01-25: qty 30, 30d supply, fill #1

## 2021-08-20 ENCOUNTER — Other Ambulatory Visit: Payer: Self-pay

## 2021-08-24 DIAGNOSIS — F4323 Adjustment disorder with mixed anxiety and depressed mood: Secondary | ICD-10-CM | POA: Diagnosis not present

## 2021-09-07 DIAGNOSIS — F4323 Adjustment disorder with mixed anxiety and depressed mood: Secondary | ICD-10-CM | POA: Diagnosis not present

## 2021-09-21 DIAGNOSIS — F4323 Adjustment disorder with mixed anxiety and depressed mood: Secondary | ICD-10-CM | POA: Diagnosis not present

## 2021-10-05 DIAGNOSIS — F4323 Adjustment disorder with mixed anxiety and depressed mood: Secondary | ICD-10-CM | POA: Diagnosis not present

## 2021-10-19 DIAGNOSIS — F4323 Adjustment disorder with mixed anxiety and depressed mood: Secondary | ICD-10-CM | POA: Diagnosis not present

## 2021-11-02 DIAGNOSIS — F4323 Adjustment disorder with mixed anxiety and depressed mood: Secondary | ICD-10-CM | POA: Diagnosis not present

## 2021-11-05 ENCOUNTER — Ambulatory Visit: Payer: 59 | Admitting: Internal Medicine

## 2021-11-05 ENCOUNTER — Encounter: Payer: Self-pay | Admitting: Internal Medicine

## 2021-11-05 VITALS — BP 104/78 | HR 65 | Temp 98.4°F | Resp 14 | Ht 58.86 in | Wt 127.0 lb

## 2021-11-05 DIAGNOSIS — M25511 Pain in right shoulder: Secondary | ICD-10-CM | POA: Diagnosis not present

## 2021-11-05 DIAGNOSIS — M545 Low back pain, unspecified: Secondary | ICD-10-CM | POA: Insufficient documentation

## 2021-11-05 NOTE — Patient Instructions (Signed)
Aspercream with lidocaine or voltaren gel shoulder  ?Lidocaine or salonpas pain patch  ? ? ?Back Exercises ?The following exercises strengthen the muscles that help to support the trunk (torso) and back. They also help to keep the lower back flexible. Doing these exercises can help to prevent or lessen existing low back pain. ?If you have back pain or discomfort, try doing these exercises 2-3 times each day or as told by your health care provider. ?As your pain improves, do them once each day, but increase the number of times that you repeat the steps for each exercise (do more repetitions). ?To prevent the recurrence of back pain, continue to do these exercises once each day or as told by your health care provider. ?Do exercises exactly as told by your health care provider and adjust them as directed. It is normal to feel mild stretching, pulling, tightness, or discomfort as you do these exercises, but you should stop right away if you feel sudden pain or your pain gets worse. ?Exercises ?Single knee to chest ?Repeat these steps 3-5 times for each leg: ?Lie on your back on a firm bed or the floor with your legs extended. ?Bring one knee to your chest. Your other leg should stay extended and in contact with the floor. ?Hold your knee in place by grabbing your knee or thigh with both hands and hold. ?Pull on your knee until you feel a gentle stretch in your lower back or buttocks. ?Hold the stretch for 10-30 seconds. ?Slowly release and straighten your leg. ? ?Pelvic tilt ?Repeat these steps 5-10 times: ?Lie on your back on a firm bed or the floor with your legs extended. ?Bend your knees so they are pointing toward the ceiling and your feet are flat on the floor. ?Tighten your lower abdominal muscles to press your lower back against the floor. This motion will tilt your pelvis so your tailbone points up toward the ceiling instead of pointing to your feet or the floor. ?With gentle tension and even breathing, hold  this position for 5-10 seconds. ? ?Cat-cow ?Repeat these steps until your lower back becomes more flexible: ?Get into a hands-and-knees position on a firm bed or the floor. Keep your hands under your shoulders, and keep your knees under your hips. You may place padding under your knees for comfort. ?Let your head hang down toward your chest. Contract your abdominal muscles and point your tailbone toward the floor so your lower back becomes rounded like the back of a cat. ?Hold this position for 5 seconds. ?Slowly lift your head, let your abdominal muscles relax, and point your tailbone up toward the ceiling so your back forms a sagging arch like the back of a cow. ?Hold this position for 5 seconds. ? ?Press-ups ?Repeat these steps 5-10 times: ?Lie on your abdomen (face-down) on a firm bed or the floor. ?Place your palms near your head, about shoulder-width apart. ?Keeping your back as relaxed as possible and keeping your hips on the floor, slowly straighten your arms to raise the top half of your body and lift your shoulders. Do not use your back muscles to raise your upper torso. You may adjust the placement of your hands to make yourself more comfortable. ?Hold this position for 5 seconds while you keep your back relaxed. ?Slowly return to lying flat on the floor. ? ?Bridges ?Repeat these steps 10 times: ?Lie on your back on a firm bed or the floor. ?Bend your knees so they are pointing  toward the ceiling and your feet are flat on the floor. Your arms should be flat at your sides, next to your body. ?Tighten your buttocks muscles and lift your buttocks off the floor until your waist is at almost the same height as your knees. You should feel the muscles working in your buttocks and the back of your thighs. If you do not feel these muscles, slide your feet 1-2 inches (2.5-5 cm) farther away from your buttocks. ?Hold this position for 3-5 seconds. ?Slowly lower your hips to the starting position, and allow your  buttocks muscles to relax completely. ?If this exercise is too easy, try doing it with your arms crossed over your chest. ?Abdominal crunches ?Repeat these steps 5-10 times: ?Lie on your back on a firm bed or the floor with your legs extended. ?Bend your knees so they are pointing toward the ceiling and your feet are flat on the floor. ?Cross your arms over your chest. ?Tip your chin slightly toward your chest without bending your neck. ?Tighten your abdominal muscles and slowly raise your torso high enough to lift your shoulder blades a tiny bit off the floor. Avoid raising your torso higher than that because it can put too much stress on your lower back and does not help to strengthen your abdominal muscles. ?Slowly return to your starting position. ? ?Back lifts ?Repeat these steps 5-10 times: ?Lie on your abdomen (face-down) with your arms at your sides, and rest your forehead on the floor. ?Tighten the muscles in your legs and your buttocks. ?Slowly lift your chest off the floor while you keep your hips pressed to the floor. Keep the back of your head in line with the curve in your back. Your eyes should be looking at the floor. ?Hold this position for 3-5 seconds. ?Slowly return to your starting position. ? ?Contact a health care provider if: ?Your back pain or discomfort gets much worse when you do an exercise. ?Your worsening back pain or discomfort does not lessen within 2 hours after you exercise. ?If you have any of these problems, stop doing these exercises right away. Do not do them again unless your health care provider says that you can. ?Get help right away if: ?You develop sudden, severe back pain. If this happens, stop doing the exercises right away. Do not do them again unless your health care provider says that you can. ?This information is not intended to replace advice given to you by your health care provider. Make sure you discuss any questions you have with your health care  provider. ?Document Revised: 12/30/2020 Document Reviewed: 09/17/2020 ?Elsevier Patient Education ? [place]. ? ?Low Back Sprain or Strain Rehab ?Ask your health care provider which exercises are safe for you. Do exercises exactly as told by your health care provider and adjust them as directed. It is normal to feel mild stretching, pulling, tightness, or discomfort as you do these exercises. Stop right away if you feel sudden pain or your pain gets worse. Do not begin these exercises until told by your health care provider. ?Stretching and range-of-motion exercises ?These exercises warm up your muscles and joints and improve the movement and flexibility of your back. These exercises also help to relieve pain, numbness, and tingling. ?Lumbar rotation ? ?Lie on your back on a firm bed or the floor with your knees bent. ?Straighten your arms out to your sides so each arm forms a 90-degree angle (right angle) with a side of your body. ?  Slowly move (rotate) both of your knees to one side of your body until you feel a stretch in your lower back (lumbar). Try not to let your shoulders lift off the floor. ?Hold this position for __________ seconds. ?Tense your abdominal muscles and slowly move your knees back to the starting position. ?Repeat this exercise on the other side of your body. ?Repeat __________ times. Complete this exercise __________ times a day. ?Single knee to chest ? ?Lie on your back on a firm bed or the floor with both legs straight. ?Bend one of your knees. Use your hands to move your knee up toward your chest until you feel a gentle stretch in your lower back and buttock. ?Hold your leg in this position by holding on to the front of your knee. ?Keep your other leg as straight as possible. ?Hold this position for __________ seconds. ?Slowly return to the starting position. ?Repeat with your other leg. ?Repeat __________ times. Complete this exercise __________ times a day. ?Prone extension on  elbows ? ?Lie on your abdomen on a firm bed or the floor (prone position). ?Prop yourself up on your elbows. ?Use your arms to help lift your chest up until you feel a gentle stretch in your abdomen and your lower back. ?This

## 2021-11-05 NOTE — Progress Notes (Addendum)
Chief Complaint  ?Patient presents with  ? Back Pain  ?  Ongoing for wks, denies any falls or injuries. States pain hurts when moving a certain way. Treating with otc ibuprofen and tylenol to get through pain when at work.   ? Shoulder Pain  ?  R, has hx of pain in the past. Has seen ortho in the past was treated with shots for relief which helped. Went to [employer] over the weekend and pain restarted.   ? ?F/u  ?1. New right shoulder pain after reaching for her purse in the car and low back pain w/o radiation x 1 month on trauma tried tylenol/advil combo pill with relief  ?3/10 pain  ? ?Review of Systems  ?Musculoskeletal:  Positive for back pain and joint pain.  ?Past Medical History:  ?Diagnosis Date  ? Acne vulgaris 11/21/2018  ? Blood in stool   ? COVID-19   ? 12/10/20  ? Hyperlipidemia 04/19/2019  ? Lipoma of back   ? Vestibular neuritis   ? 2010  ? ?Past Surgical History:  ?Procedure Laterality Date  ? LIPOMA EXCISION Left 12/21/2018  ? Procedure: EXCISION LIPOMA OF BACK;  Surgeon: Wallace Going, DO;  Location: [facility];  Service: Plastics;  Laterality: Left;  ? ?Family History  ?Problem Relation Age of Onset  ? Diabetes Mother   ? Hyperlipidemia Mother   ? Hypertension Mother   ? Hyperlipidemia Father   ? Diabetes Maternal Uncle   ? Hyperlipidemia Maternal Grandmother   ? Cancer Maternal Grandfather   ?     lung smoker  ? Cancer Paternal Grandfather   ?     colon  ? Diabetes Maternal Uncle   ? ?Social History  ? ?Socioeconomic History  ? Marital status: Unknown  ?  Spouse name: Not on file  ? Number of children: Not on file  ? Years of education: Not on file  ? Highest education level: Not on file  ?Occupational History  ? Not on file  ?Tobacco Use  ? Smoking status: Never  ? Smokeless tobacco: Never  ?Substance and Sexual Activity  ? Alcohol use: Yes  ?  Comment: occasionally  ? Drug use: No  ? Sexual activity: Yes  ?  Birth control/protection: I.U.D.  ?Other Topics Concern  ? Not on file   ?Social History Narrative  ? 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 Masters  ? Married   ? 1 son   ? Owns guns, wears seatbelt, safe in relationship   ? Only child  ?   ? ?Social Determinants of Health  ? ?Financial Resource Strain: Not on file  ?Food Insecurity: Not on file  ?Transportation Needs: Not on file  ?Physical Activity: Not on file  ?Stress: Not on file  ?Social Connections: Not on file  ?Intimate Partner Violence: Not on file  ? ?Current Meds  ?Medication Sig  ? clindamycin (CLEOCIN T) 1 % external solution Apply to affected area twice a day  ? clindamycin (CLEOCIN T) 1 % external solution Apply to affected area twice a day.  ? Cyanocobalamin (VITAMIN B-12 PO) Take 3,000 mcg by mouth daily.  ? levonorgestrel (MIRENA) 20 MCG/24HR IUD 1 each by Intrauterine route once.  ? Magnesium 250 MG TABS Take 250 mg by mouth daily.  ? Multiple Vitamin (MULTIVITAMIN) tablet Take 1 tablet by mouth daily.  ? spironolactone (ALDACTONE) 50 MG tablet TAKE 1 TABLET BY MOUTH ONCE DAILY EVERY MORNING  ? SUMAtriptan (  IMITREX) 50 MG tablet Take 1 tablet (50 mg total) by mouth every 2 (two) hours as needed for migraine. May repeat in 2 hours if headache persists or recurs.  ? tretinoin (RETIN-A) 0.1 % cream Apply topically at bedtime. With moisturizer  ? valACYclovir (VALTREX) 500 MG tablet Take 1 tablet (500 mg total) by mouth daily as needed. Bid to 3-5 days outbreak  ? ?No Known Allergies ?No results found for this or any previous visit (from the past 2160 hour(s)). ?Objective  ?Body mass index is 25.77 kg/m?. ?Wt Readings from Last 3 Encounters:  ?11/05/21 127 lb (57.6 kg)  ?06/17/21 126 lb (57.2 kg)  ?12/11/20 120 lb (54.4 kg)  ? ?Temp Readings from Last 3 Encounters:  ?11/05/21 98.4 ?F (36.9 ?C) (Oral)  ?06/17/21 (!) 97.5 ?F (36.4 ?C) (Temporal)  ?04/24/20 (!) 97.2 ?F (36.2 ?C) (Temporal)  ? ?BP Readings from Last 3 Encounters:  ?11/05/21 104/78  ?06/17/21 118/74  ?06/05/20 96/65  ? ?Pulse Readings from Last 3  Encounters:  ?11/05/21 65  ?06/17/21 79  ?06/05/20 65  ? ? ?Physical Exam ?Vitals and nursing note reviewed.  ?Constitutional:   ?   Appearance: Normal appearance. She is well-developed and well-groomed.  ?HENT:  ?   Head: Normocephalic and atraumatic.  ?Eyes:  ?   Conjunctiva/sclera: Conjunctivae normal.  ?   Pupils: Pupils are equal, round, and reactive to light.  ?Cardiovascular:  ?   Rate and Rhythm: Normal rate and regular rhythm.  ?   Heart sounds: Normal heart sounds. No murmur heard. ?Pulmonary:  ?   Effort: Pulmonary effort is normal.  ?   Breath sounds: Normal breath sounds.  ?Abdominal:  ?   General: Abdomen is flat. Bowel sounds are normal.  ?   Tenderness: There is no abdominal tenderness.  ?Musculoskeletal:  ?   Right shoulder: Tenderness present.  ?     Arms: ? ?Skin: ?   General: Skin is warm and dry.  ?Neurological:  ?   General: No focal deficit present.  ?   Mental Status: She is alert and oriented to person, place, and time. Mental status is at baseline.  ?   Cranial Nerves: Cranial nerves 2-12 are intact.  ?   Motor: Motor function is intact.  ?   Coordination: Coordination is intact.  ?   Gait: Gait is intact.  ?Psychiatric:     ?   Attention and Perception: Attention and perception normal.     ?   Mood and Affect: Mood and affect normal.     ?   Speech: Speech normal.     ?   Behavior: Behavior normal. Behavior is cooperative.     ?   Thought Content: Thought content normal.     ?   Cognition and Memory: Cognition and memory normal.     ?   Judgment: Judgment normal.  ? ? ?Assessment  ?Plan  ?Acute midline low back pain without sciatica - Plan: DG Lumbar Spine Complete, Ambulatory referral to Physical Therapy benchmark ? ?Aspercream with lidocaine or voltaren gel shoulder  ?Lidocaine or salonpas pain patch  ?Otc tylenol/advil  ?Consider depomedrol 40 mg x 1 and toradol 30 mg qd x 1 can come back if pain worsening for nurse visit ? ?Acute pain of right shoulder - Plan: DG Shoulder Right,  Ambulatory referral to Physical Therapy ? ?Provider: Dr. Olivia Mackie McLean-Scocuzza-Internal Medicine  ?

## 2021-11-06 ENCOUNTER — Ambulatory Visit
Admission: RE | Admit: 2021-11-06 | Discharge: 2021-11-06 | Disposition: A | Discharge status: Home / Self Care | Payer: 59 | Source: Ambulatory Visit | Attending: Internal Medicine | Admitting: Internal Medicine

## 2021-11-06 ENCOUNTER — Ambulatory Visit
Admission: RE | Admit: 2021-11-06 | Discharge: 2021-11-06 | Disposition: A | Discharge status: Home / Self Care | Payer: 59 | Attending: Internal Medicine | Admitting: Internal Medicine

## 2021-11-06 DIAGNOSIS — M25511 Pain in right shoulder: Secondary | ICD-10-CM | POA: Insufficient documentation

## 2021-11-06 DIAGNOSIS — M545 Low back pain, unspecified: Secondary | ICD-10-CM | POA: Insufficient documentation

## 2021-11-06 DIAGNOSIS — M419 Scoliosis, unspecified: Secondary | ICD-10-CM | POA: Diagnosis not present

## 2021-11-09 NOTE — Progress Notes (Signed)
Xray right shoulder normal  ?Rec. PT  ?

## 2021-11-10 ENCOUNTER — Telehealth: Payer: Self-pay

## 2021-11-10 NOTE — Telephone Encounter (Signed)
Lvm for pt to return call in regards to x-ray results. ? ?Per Dr.Tracy: ?Mild curvature of spine to the right/scoliosis  ?Rec PT ? ?Xray right shoulder normal  ?Rec. PT  ?

## 2021-11-24 DIAGNOSIS — M25511 Pain in right shoulder: Secondary | ICD-10-CM | POA: Diagnosis not present

## 2021-11-24 DIAGNOSIS — M5459 Other low back pain: Secondary | ICD-10-CM | POA: Diagnosis not present

## 2021-12-02 DIAGNOSIS — M5459 Other low back pain: Secondary | ICD-10-CM | POA: Diagnosis not present

## 2021-12-02 DIAGNOSIS — M25511 Pain in right shoulder: Secondary | ICD-10-CM | POA: Diagnosis not present

## 2021-12-08 DIAGNOSIS — M25511 Pain in right shoulder: Secondary | ICD-10-CM | POA: Diagnosis not present

## 2021-12-08 DIAGNOSIS — M5459 Other low back pain: Secondary | ICD-10-CM | POA: Diagnosis not present

## 2021-12-22 DIAGNOSIS — M5459 Other low back pain: Secondary | ICD-10-CM | POA: Diagnosis not present

## 2021-12-22 DIAGNOSIS — M25511 Pain in right shoulder: Secondary | ICD-10-CM | POA: Diagnosis not present

## 2021-12-28 DIAGNOSIS — F4323 Adjustment disorder with mixed anxiety and depressed mood: Secondary | ICD-10-CM | POA: Diagnosis not present

## 2022-01-05 DIAGNOSIS — M25511 Pain in right shoulder: Secondary | ICD-10-CM | POA: Diagnosis not present

## 2022-01-05 DIAGNOSIS — M5459 Other low back pain: Secondary | ICD-10-CM | POA: Diagnosis not present

## 2022-01-06 IMAGING — CT CT HEART MORP W/ CTA COR W/ SCORE W/ CA W/CM &/OR W/O CM
4 of 7 series · 8 of 20 positions shown, 9 images · IV contrast (APPLIED)
Comparison: None.
COMPARISON: None.

Addendum:
EXAM:
OVER-READ INTERPRETATION  CT CHEST

The following report is an over-read performed by radiologist Dr.
Miguel Mickyto Galan Freyle [[email or web address]] on 06/05/2020. This
over-read does not include interpretation of cardiac or coronary
anatomy or pathology. The coronary calcium score/coronary CTA
interpretation by the cardiologist is attached.
CLINICAL DATA: 39-year-old female with h/o hyperlipidemia and
dyspnea on exertion.
Cardiac/Coronary  CTA
TECHNIQUE: The patient was scanned on a [employer] Force scanner.

[Series 6: best diast 73 % · axial · 0.39mm/px · z∈[-75,-41]mm · 2 of 258 slices shown]
[im 86/258  vessel]
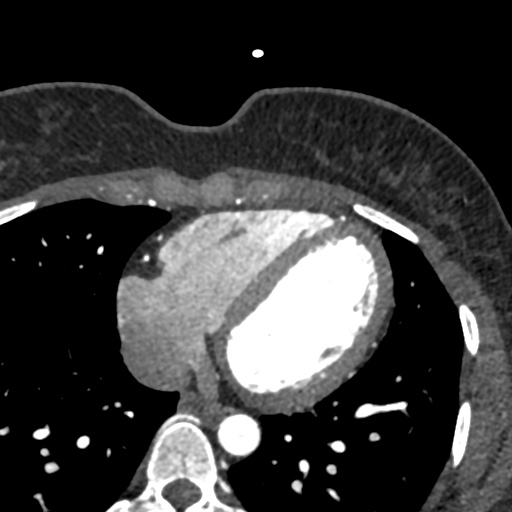
[im 172/258  vessel]
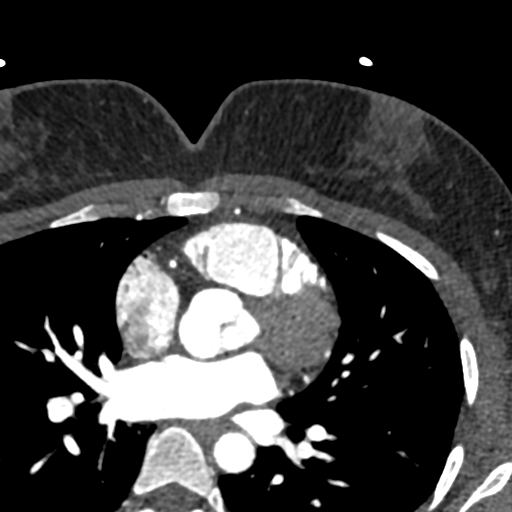

[Series 7: best syst · axial · 0.39mm/px · z∈[-75,-41]mm · 2 of 258 slices shown, 3 images]
[im 86/258  vessel]
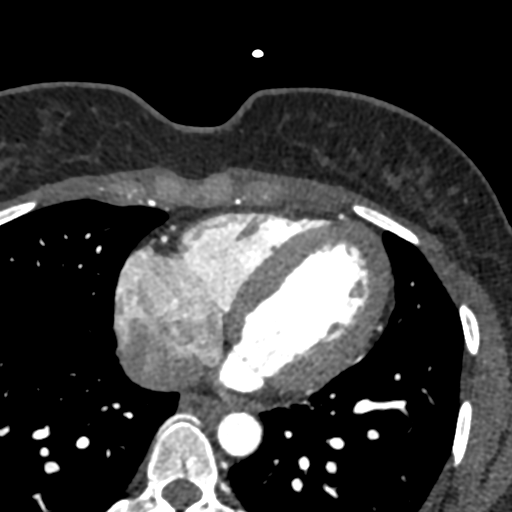
[im 86/258  lung]
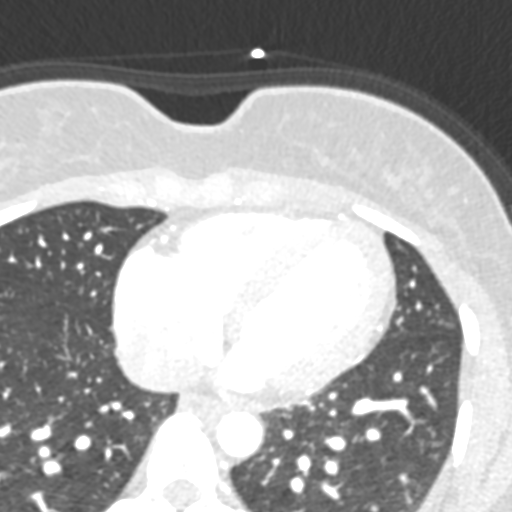
[im 172/258  vessel]
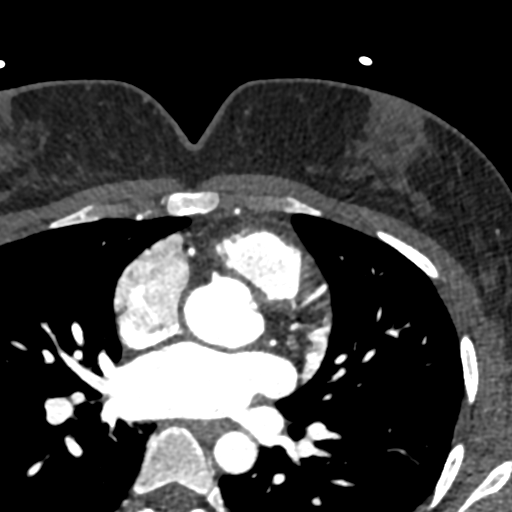

[Series 8: ts diast sharp · axial · 0.39mm/px · z∈[-75,-41]mm · 2 of 258 slices shown]
[im 86/258  lung]
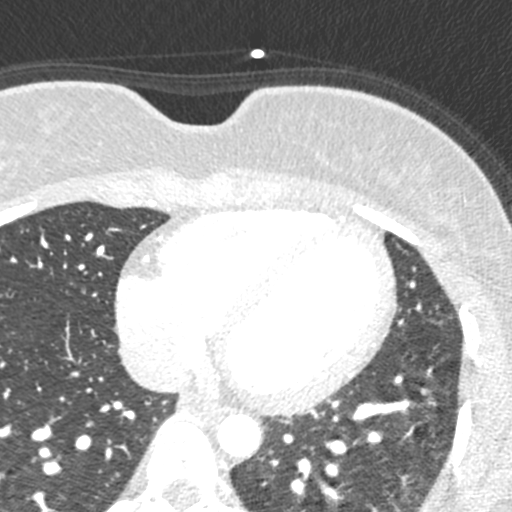
[im 172/258  lung]
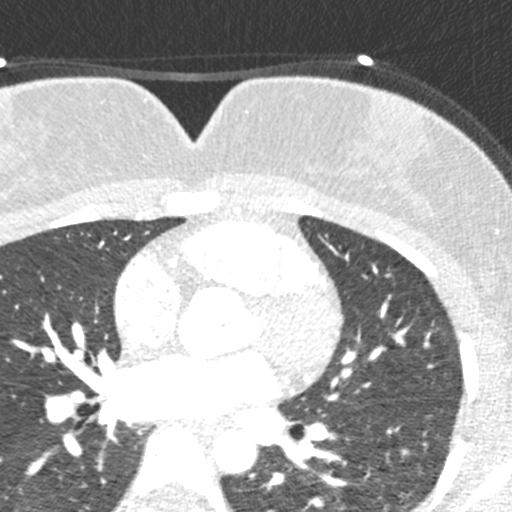

[Series 9: ts syst sharp · axial · 0.39mm/px · z∈[-75,-41]mm · 2 of 258 slices shown]
[im 86/258  lung]
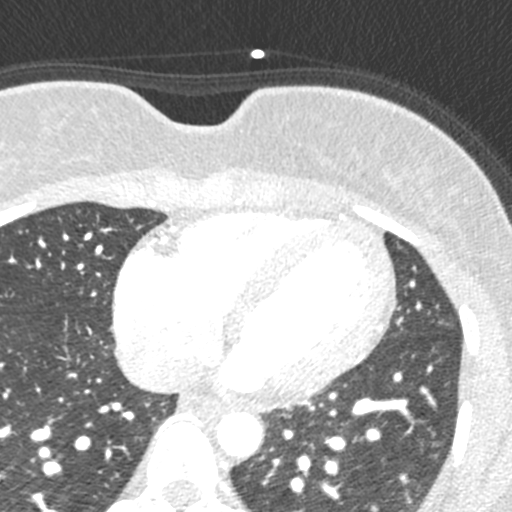
[im 172/258  lung]
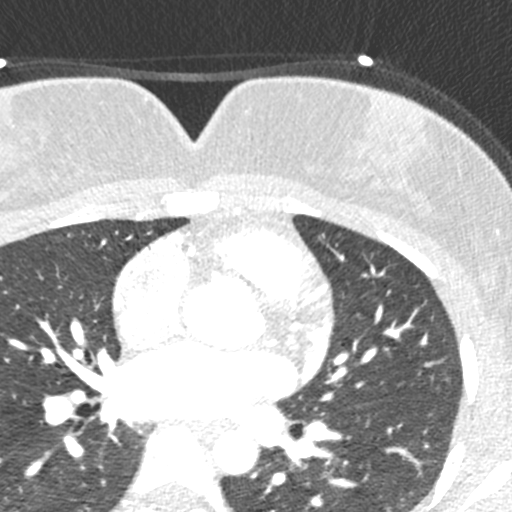

[8 of 20 positions shown; findings below may reference images not displayed]

FINDINGS: Within the visualized portions of the thorax there are no suspicious
appearing pulmonary nodules or masses, there is no acute
consolidative airspace disease, no pleural effusions, no
pneumothorax and no lymphadenopathy. Visualized portions of the
upper abdomen are unremarkable. There are no aggressive appearing
lytic or blastic lesions noted in the visualized portions of the
skeleton.
IMPRESSION: No significant incidental noncardiac findings are noted.
FINDINGS: A 100 kV prospective scan was triggered in the descending thoracic
aorta at 111 HU's. Axial non-contrast 3 mm slices were carried out
through the heart. The data set was analyzed on a dedicated work
station and scored using the Agatson method. Gantry rotation speed
was 250 msecs and collimation was .6 mm. 50 mg of PO Metoprolol and
0.8 mg of sl NTG was given. The 3D data set was reconstructed in 5%
intervals of the 67-82 % of the R-R cycle. Diastolic phases were
analyzed on a dedicated work station using MPR, MIP and VRT modes.
The patient received 80 cc of contrast.

Aorta:  Normal size.  No calcifications.  No dissection.

Aortic Valve:  Trileaflet.  No calcifications.

Coronary Arteries:  Normal coronary origin.  Right dominance.

RCA is a large dominant artery that gives rise to PDA and PLA. There
is no plaque.

Left main is a large artery that gives rise to LAD and LCX arteries.

LAD is a large vessel that has no plaque.

LCX is a non-dominant artery that gives rise to two large OM
branches. There is no plaque.

Other findings:

Normal pulmonary vein drainage into the left atrium.

Normal left atrial appendage without a thrombus.

Normal size of the pulmonary artery.
IMPRESSION: 1. Coronary calcium score of 0. This was 0 percentile for age and
sex matched control.

2. Normal coronary origin with right dominance.

3. CAD-RADS 0. No evidence of CAD (0%). Consider non-atherosclerotic
causes of chest pain.

*** End of Addendum ***
EXAM:
OVER-READ INTERPRETATION  CT CHEST

The following report is an over-read performed by radiologist Dr.
Miguel Mickyto Galan Freyle [REDACTED] on 06/05/2020. This
over-read does not include interpretation of cardiac or coronary
anatomy or pathology. The coronary calcium score/coronary CTA
interpretation by the cardiologist is attached.
FINDINGS: Within the visualized portions of the thorax there are no suspicious
appearing pulmonary nodules or masses, there is no acute
consolidative airspace disease, no pleural effusions, no
pneumothorax and no lymphadenopathy. Visualized portions of the
upper abdomen are unremarkable. There are no aggressive appearing
lytic or blastic lesions noted in the visualized portions of the
skeleton.
IMPRESSION: No significant incidental noncardiac findings are noted.

## 2022-01-11 DIAGNOSIS — F4323 Adjustment disorder with mixed anxiety and depressed mood: Secondary | ICD-10-CM | POA: Diagnosis not present

## 2022-01-25 ENCOUNTER — Other Ambulatory Visit: Payer: Self-pay

## 2022-01-25 DIAGNOSIS — F4323 Adjustment disorder with mixed anxiety and depressed mood: Secondary | ICD-10-CM | POA: Diagnosis not present

## 2022-02-08 DIAGNOSIS — F4323 Adjustment disorder with mixed anxiety and depressed mood: Secondary | ICD-10-CM | POA: Diagnosis not present

## 2022-02-22 DIAGNOSIS — F4323 Adjustment disorder with mixed anxiety and depressed mood: Secondary | ICD-10-CM | POA: Diagnosis not present

## 2022-02-25 ENCOUNTER — Telehealth: Payer: Self-pay | Admitting: Internal Medicine

## 2022-02-25 NOTE — Telephone Encounter (Signed)
Pt scheduled thank you

## 2022-02-25 NOTE — Telephone Encounter (Signed)
Pt called this morning requesting a Transfer of Care from  Dr. Terese Door - [facility]  to  Dr. Volanda Napoleon - [facility]  Please advise.

## 2022-02-25 NOTE — Telephone Encounter (Signed)
Okay for TOC. Please call pt, to schedule. She is requesting 05/19/22.

## 2022-03-08 DIAGNOSIS — F4323 Adjustment disorder with mixed anxiety and depressed mood: Secondary | ICD-10-CM | POA: Diagnosis not present

## 2022-04-05 DIAGNOSIS — F4323 Adjustment disorder with mixed anxiety and depressed mood: Secondary | ICD-10-CM | POA: Diagnosis not present

## 2022-04-19 DIAGNOSIS — F4323 Adjustment disorder with mixed anxiety and depressed mood: Secondary | ICD-10-CM | POA: Diagnosis not present

## 2022-05-10 DIAGNOSIS — F4323 Adjustment disorder with mixed anxiety and depressed mood: Secondary | ICD-10-CM | POA: Diagnosis not present

## 2022-05-19 ENCOUNTER — Other Ambulatory Visit (HOSPITAL_COMMUNITY)
Admission: RE | Admit: 2022-05-19 | Discharge: 2022-05-19 | Disposition: A | Discharge status: Home / Self Care | Payer: 59 | Source: Ambulatory Visit | Attending: Obstetrics and Gynecology | Admitting: Obstetrics and Gynecology

## 2022-05-19 ENCOUNTER — Other Ambulatory Visit: Payer: Self-pay | Admitting: Obstetrics and Gynecology

## 2022-05-19 ENCOUNTER — Encounter: Payer: 59 | Admitting: Family Medicine

## 2022-05-19 DIAGNOSIS — Z9189 Other specified personal risk factors, not elsewhere classified: Secondary | ICD-10-CM | POA: Diagnosis not present

## 2022-05-19 DIAGNOSIS — Z01419 Encounter for gynecological examination (general) (routine) without abnormal findings: Secondary | ICD-10-CM | POA: Insufficient documentation

## 2022-05-19 DIAGNOSIS — H5213 Myopia, bilateral: Secondary | ICD-10-CM | POA: Diagnosis not present

## 2022-05-19 DIAGNOSIS — Z7251 High risk heterosexual behavior: Secondary | ICD-10-CM | POA: Diagnosis not present

## 2022-05-24 LAB — CYTOLOGY - PAP
Comment: NEGATIVE
Diagnosis: UNDETERMINED — AB
High risk HPV: NEGATIVE

## 2022-06-13 DIAGNOSIS — F4323 Adjustment disorder with mixed anxiety and depressed mood: Secondary | ICD-10-CM | POA: Diagnosis not present

## 2022-06-17 ENCOUNTER — Ambulatory Visit (INDEPENDENT_AMBULATORY_CARE_PROVIDER_SITE_OTHER): Payer: 59 | Admitting: Family Medicine

## 2022-06-17 ENCOUNTER — Other Ambulatory Visit: Payer: Self-pay

## 2022-06-17 ENCOUNTER — Encounter: Payer: Self-pay | Admitting: Family Medicine

## 2022-06-17 VITALS — BP 100/80 | HR 75 | Temp 98.6°F | Wt 126.0 lb

## 2022-06-17 DIAGNOSIS — Z0000 Encounter for general adult medical examination without abnormal findings: Secondary | ICD-10-CM

## 2022-06-17 DIAGNOSIS — L659 Nonscarring hair loss, unspecified: Secondary | ICD-10-CM | POA: Diagnosis not present

## 2022-06-17 DIAGNOSIS — L700 Acne vulgaris: Secondary | ICD-10-CM

## 2022-06-17 DIAGNOSIS — G43011 Migraine without aura, intractable, with status migrainosus: Secondary | ICD-10-CM

## 2022-06-17 LAB — CBC WITH DIFFERENTIAL/PLATELET
Basophils Absolute: 0 10*3/uL (ref 0.0–0.1)
Basophils Relative: 0.5 % (ref 0.0–3.0)
Eosinophils Absolute: 0.1 10*3/uL (ref 0.0–0.7)
Eosinophils Relative: 0.9 % (ref 0.0–5.0)
HCT: 43.2 % (ref 36.0–46.0)
Hemoglobin: 14.6 g/dL (ref 12.0–15.0)
Lymphocytes Relative: 34.3 % (ref 12.0–46.0)
Lymphs Abs: 2.4 10*3/uL (ref 0.7–4.0)
MCHC: 33.8 g/dL (ref 30.0–36.0)
MCV: 85.6 fl (ref 78.0–100.0)
Monocytes Absolute: 0.4 10*3/uL (ref 0.1–1.0)
Monocytes Relative: 6.3 % (ref 3.0–12.0)
Neutro Abs: 4.1 10*3/uL (ref 1.4–7.7)
Neutrophils Relative %: 58 % (ref 43.0–77.0)
Platelets: 311 10*3/uL (ref 150.0–400.0)
RBC: 5.05 Mil/uL (ref 3.87–5.11)
RDW: 13.1 % (ref 11.5–15.5)
WBC: 7.1 10*3/uL (ref 4.0–10.5)

## 2022-06-17 LAB — TSH: TSH: 1.73 u[IU]/mL (ref 0.35–5.50)

## 2022-06-17 LAB — LIPID PANEL
Cholesterol: 200 mg/dL (ref 0–200)
HDL: 61.9 mg/dL (ref >39.00)
LDL Cholesterol: 127 mg/dL — ABNORMAL HIGH (ref 0–99)
NonHDL: 138.2
Total CHOL/HDL Ratio: 3
Triglycerides: 58 mg/dL (ref 0.0–149.0)
VLDL: 11.6 mg/dL (ref 0.0–40.0)

## 2022-06-17 LAB — BASIC METABOLIC PANEL
BUN: 13 mg/dL (ref 6–23)
CO2: 27 mEq/L (ref 19–32)
Calcium: 10 mg/dL (ref 8.4–10.5)
Chloride: 106 mEq/L (ref 96–112)
Creatinine, Ser: 0.88 mg/dL (ref 0.40–1.20)
GFR: 82.83 mL/min (ref >60.00)
Glucose, Bld: 95 mg/dL (ref 70–99)
Potassium: 4 mEq/L (ref 3.5–5.1)
Sodium: 139 mEq/L (ref 135–145)

## 2022-06-17 LAB — HEMOGLOBIN A1C: Hgb A1c MFr Bld: 5.8 % (ref 4.6–6.5)

## 2022-06-17 MED ORDER — MAGNESIUM 250 MG PO TABS
250.0000 mg | ORAL_TABLET | Freq: Every day | ORAL | 3 refills | Status: AC
  Filled 2022-06-17: qty 90, 90d supply, fill #0

## 2022-06-17 MED ORDER — VALACYCLOVIR HCL 500 MG PO TABS
500.0000 mg | ORAL_TABLET | Freq: Every day | ORAL | 0 refills | Status: DC | PRN
  Filled 2022-06-17: qty 90, 45d supply, fill #0

## 2022-06-17 MED ORDER — TRETINOIN 0.1 % EX CREA
TOPICAL_CREAM | Freq: Every day | CUTANEOUS | 11 refills | Status: DC
  Filled 2022-06-17 – 2023-02-09 (×2): qty 45, 30d supply, fill #0

## 2022-06-17 MED ORDER — SPIRONOLACTONE 50 MG PO TABS
ORAL_TABLET | Freq: Every morning | ORAL | 3 refills | Status: DC
  Filled 2022-06-17 – 2023-02-09 (×2): qty 90, 90d supply, fill #0

## 2022-06-17 MED ORDER — SUMATRIPTAN SUCCINATE 50 MG PO TABS
50.0000 mg | ORAL_TABLET | ORAL | 11 refills | Status: AC | PRN
  Filled 2022-06-17: qty 9, 15d supply, fill #0

## 2022-06-17 MED ORDER — CLINDAMYCIN PHOSPHATE 1 % EX SOLN
CUTANEOUS | 11 refills | Status: DC
  Filled 2022-06-17 – 2023-02-09 (×2): qty 30, 30d supply, fill #0

## 2022-06-17 NOTE — Progress Notes (Signed)
Subjective:     Annette Gutierrez is a 41 y.o. female and is here for a comprehensive physical exam. The patient reports doing well overall.  Requesting refills on chronic medications.  Plans to restart Parisien for headache prevention.  Has not had a migraine in a while, but is noticing a dull headache started.  Using CeraVe, a 3 and 1 cleanser, clindamycin, treated tretinoin cream and spironolactone for skin.  Patient notes an area of hair thinning and occipital area near hairline.  Wearing statin Lined scrub cap that ties near area of hair thinning.  Followed by OB/GYN for Pap.  Social History   Socioeconomic History   Marital status: Unknown    Spouse name: Not on file   Number of children: Not on file   Years of education: Not on file   Highest education level: Not on file  Occupational History   Not on file  Tobacco Use   Smoking status: Never   Smokeless tobacco: Never  Substance and Sexual Activity   Alcohol use: Yes    Comment: occasionally   Drug use: No   Sexual activity: Yes    Birth control/protection: I.U.D.  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   Married    1 son    Owns guns, wears seatbelt, safe in relationship    Only child      Social Determinants of Health   Financial Resource Strain: Not on file  Food Insecurity: Not on file  Transportation Needs: Not on file  Physical Activity: Not on file  Stress: Not on file  Social Connections: Not on file  Intimate Partner Violence: Not on file   Health Maintenance  Topic Date Due   INFLUENZA VACCINE  02/16/2022   COVID-19 Vaccine (4 - 2023-24 season) 03/19/2022   Hepatitis C Screening  06/17/2022 (Originally 02/15/2001)   HIV Screening  06/17/2022 (Originally 02/15/1998)   PAP SMEAR-Modifier  05/19/2025   DTaP/Tdap/Td (9 - Td or Tdap) 11/13/2026   HPV VACCINES  Completed    The following portions of the patient's history were  reviewed and updated as appropriate: allergies, current medications, past family history, past medical history, past social history, past surgical history, and problem list.  Review of Systems Pertinent items noted in HPI and remainder of comprehensive ROS otherwise negative.   Objective:    BP 100/80 (BP Location: Left Arm, Patient Position: Sitting, Cuff Size: Normal)   Pulse 75   Temp 98.6 F (37 C) (Oral)   Wt 126 lb (57.2 kg)   LMP 03/30/2022 (Approximate)   SpO2 96%   BMI 25.57 kg/m  General appearance: alert, cooperative, and no distress Head: Normocephalic, without obvious abnormality, atraumatic Eyes: conjunctivae/corneas clear. PERRL, EOM's intact. Fundi benign. Ears: normal TM's and external ear canals both ears Nose: Nares normal. Septum midline. Mucosa normal. No drainage or sinus tenderness. Throat: lips, mucosa, and tongue normal; teeth and gums normal Neck: no adenopathy, no carotid bruit, no JVD, supple, symmetrical, trachea midline, and thyroid not enlarged, symmetric, no tenderness/mass/nodules Lungs: clear to auscultation bilaterally Heart: regular rate and rhythm, S1, S2 normal, no murmur, click, rub or gallop Abdomen: soft, non-tender; bowel sounds normal; no masses,  no organomegaly Extremities: extremities normal, atraumatic, no cyanosis or edema Pulses: 2+ and symmetric Skin: Skin color, texture, turgor normal. No rashes or lesions Lymph nodes: Cervical, supraclavicular, and axillary nodes normal. Neurologic: Alert  and oriented X 3, normal strength and tone. Normal symmetric reflexes. Normal coordination and gait    Assessment:    Healthy female exam.      Plan:    Anticipatory guidance given including wearing seatbelts, smoke detectors in the home, increasing physical activity, increasing p.o. intake of water and vegetables. -labs -Immunizations reviewed and up-to-date -Pap done 05/24/2022 with OB/GYN -Next CPE in 1 year See After Visit Summary for  Counseling Recommendations  Well adult exam - Plan: Lipid panel, CBC with Differential/Platelet, TSH, Hemoglobin U2G, Basic metabolic panel  Hair thinning -Likely 2/2 friction from prescription worn daily - Plan: Basic metabolic panel, TSH  Intractable migraine without aura and with status migrainosus -Stable -Restart magnesium.  Increase intake of water -Imitrex as needed - Plan: SUMAtriptan (IMITREX) 50 MG tablet, Magnesium 250 MG TABS, basic metabolic panel  Acne vulgaris  - Plan: clindamycin (CLEOCIN T) 1 % external solution, spironolactone (ALDACTONE) 50 MG tablet, tretinoin (RETIN-A) 0.1 % cream  F/u prn  Grier Mitts, MD

## 2022-06-18 ENCOUNTER — Encounter: Payer: 59 | Admitting: Internal Medicine

## 2022-06-28 DIAGNOSIS — F4323 Adjustment disorder with mixed anxiety and depressed mood: Secondary | ICD-10-CM | POA: Diagnosis not present

## 2022-06-29 ENCOUNTER — Other Ambulatory Visit: Payer: Self-pay

## 2022-07-26 DIAGNOSIS — F4323 Adjustment disorder with mixed anxiety and depressed mood: Secondary | ICD-10-CM | POA: Diagnosis not present

## 2022-08-09 DIAGNOSIS — F4323 Adjustment disorder with mixed anxiety and depressed mood: Secondary | ICD-10-CM | POA: Diagnosis not present

## 2022-08-23 DIAGNOSIS — F4323 Adjustment disorder with mixed anxiety and depressed mood: Secondary | ICD-10-CM | POA: Diagnosis not present

## 2022-09-05 DIAGNOSIS — F4323 Adjustment disorder with mixed anxiety and depressed mood: Secondary | ICD-10-CM | POA: Diagnosis not present

## 2022-09-19 DIAGNOSIS — F411 Generalized anxiety disorder: Secondary | ICD-10-CM | POA: Diagnosis not present

## 2022-10-03 DIAGNOSIS — F411 Generalized anxiety disorder: Secondary | ICD-10-CM | POA: Diagnosis not present

## 2022-10-31 DIAGNOSIS — F4323 Adjustment disorder with mixed anxiety and depressed mood: Secondary | ICD-10-CM | POA: Diagnosis not present

## 2022-12-26 DIAGNOSIS — F4323 Adjustment disorder with mixed anxiety and depressed mood: Secondary | ICD-10-CM | POA: Diagnosis not present

## 2023-02-06 DIAGNOSIS — F4323 Adjustment disorder with mixed anxiety and depressed mood: Secondary | ICD-10-CM | POA: Diagnosis not present

## 2023-02-09 ENCOUNTER — Other Ambulatory Visit (HOSPITAL_COMMUNITY): Payer: Self-pay

## 2023-02-18 ENCOUNTER — Other Ambulatory Visit (HOSPITAL_COMMUNITY): Payer: Self-pay

## 2023-03-06 DIAGNOSIS — F4323 Adjustment disorder with mixed anxiety and depressed mood: Secondary | ICD-10-CM | POA: Diagnosis not present

## 2023-04-03 DIAGNOSIS — F4323 Adjustment disorder with mixed anxiety and depressed mood: Secondary | ICD-10-CM | POA: Diagnosis not present

## 2023-04-17 DIAGNOSIS — F4323 Adjustment disorder with mixed anxiety and depressed mood: Secondary | ICD-10-CM | POA: Diagnosis not present

## 2023-05-01 DIAGNOSIS — F4323 Adjustment disorder with mixed anxiety and depressed mood: Secondary | ICD-10-CM | POA: Diagnosis not present

## 2023-05-16 ENCOUNTER — Encounter: Payer: Commercial Managed Care - PPO | Admitting: Family Medicine

## 2023-05-25 ENCOUNTER — Other Ambulatory Visit: Payer: Self-pay | Admitting: Obstetrics and Gynecology

## 2023-05-25 ENCOUNTER — Ambulatory Visit
Admission: RE | Admit: 2023-05-25 | Discharge: 2023-05-25 | Disposition: A | Discharge status: Home / Self Care | Payer: Commercial Managed Care - PPO | Source: Ambulatory Visit | Attending: Obstetrics and Gynecology | Admitting: Obstetrics and Gynecology

## 2023-05-25 ENCOUNTER — Other Ambulatory Visit (HOSPITAL_COMMUNITY)
Admission: RE | Admit: 2023-05-25 | Discharge: 2023-05-25 | Disposition: A | Discharge status: Home / Self Care | Payer: Commercial Managed Care - PPO | Source: Ambulatory Visit | Attending: Obstetrics and Gynecology | Admitting: Obstetrics and Gynecology

## 2023-05-25 DIAGNOSIS — Z01419 Encounter for gynecological examination (general) (routine) without abnormal findings: Secondary | ICD-10-CM | POA: Diagnosis not present

## 2023-05-25 DIAGNOSIS — Z1231 Encounter for screening mammogram for malignant neoplasm of breast: Secondary | ICD-10-CM

## 2023-05-25 DIAGNOSIS — R896 Abnormal cytological findings in specimens from other organs, systems and tissues: Secondary | ICD-10-CM | POA: Diagnosis not present

## 2023-05-25 DIAGNOSIS — Z7251 High risk heterosexual behavior: Secondary | ICD-10-CM | POA: Diagnosis not present

## 2023-05-30 LAB — CYTOLOGY - PAP
Comment: NEGATIVE
Diagnosis: UNDETERMINED — AB
High risk HPV: NEGATIVE

## 2023-05-31 ENCOUNTER — Other Ambulatory Visit: Payer: Self-pay | Admitting: Obstetrics and Gynecology

## 2023-05-31 DIAGNOSIS — R928 Other abnormal and inconclusive findings on diagnostic imaging of breast: Secondary | ICD-10-CM

## 2023-06-09 IMAGING — CR DG LUMBAR SPINE COMPLETE 4+V
1 series · 5 of 5 positions shown · non-contrast
Comparison: No recent.

CLINICAL DATA: History of low back pain.

EXAM:
LUMBAR SPINE - COMPLETE 4+ VIEW

[Series 1: dg lumbar spine complete 4 +v · 0.14mm/px · 5 of 5 slices shown]
[im 1/5]
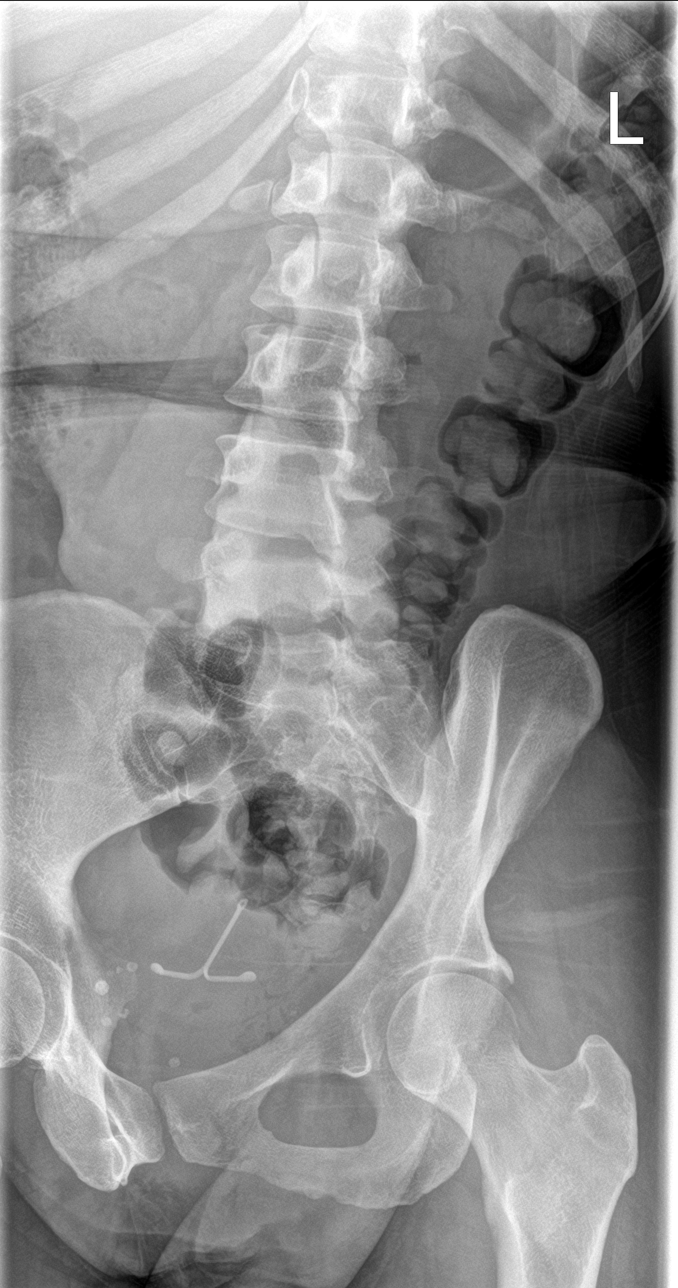
[im 2/5]
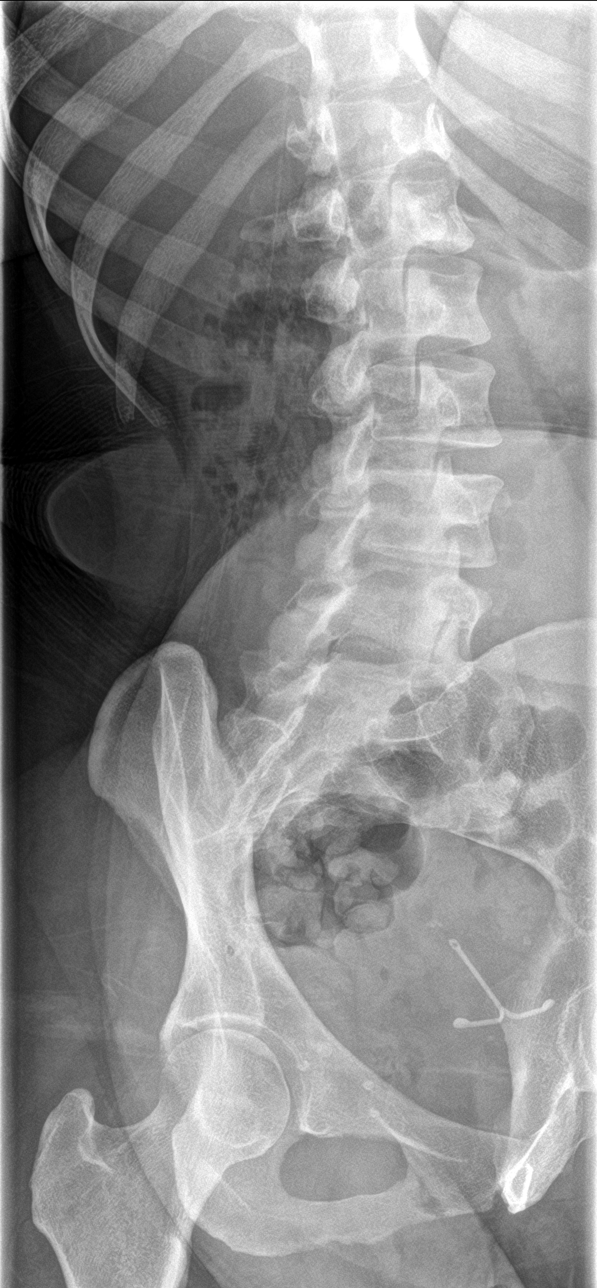
[im 3/5]
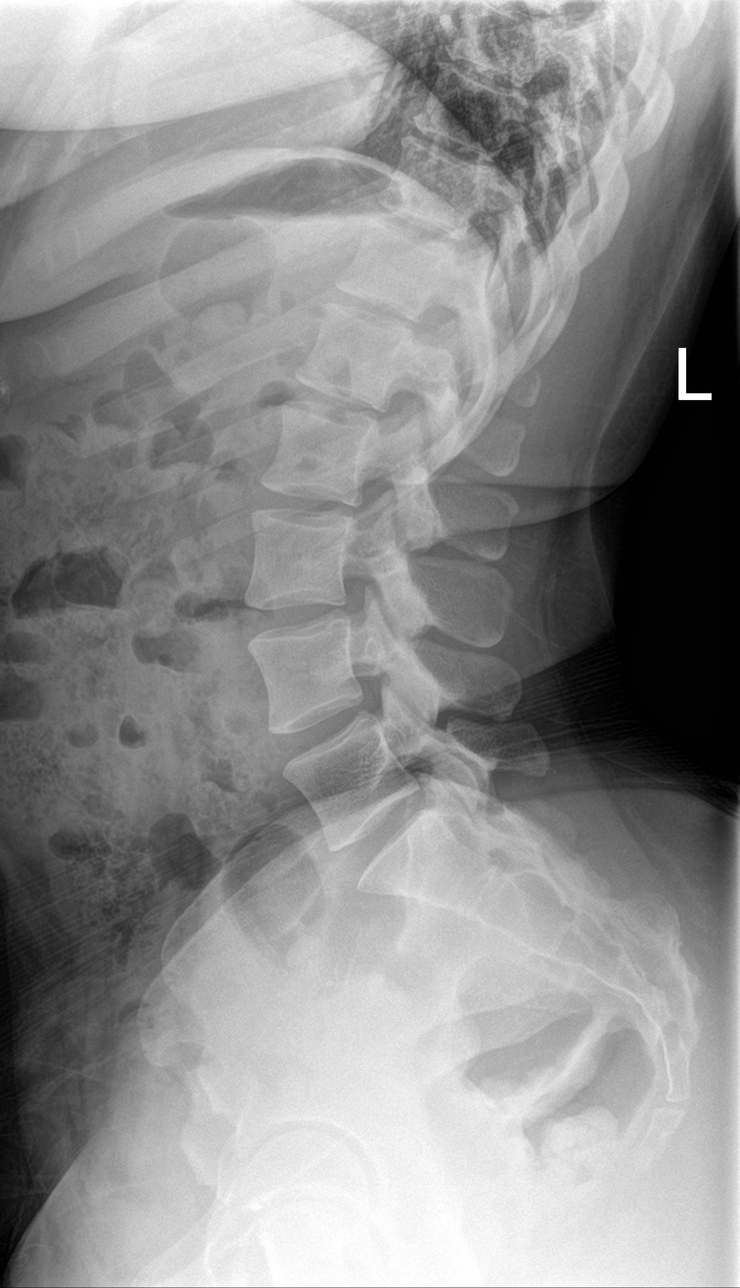
[im 4/5]
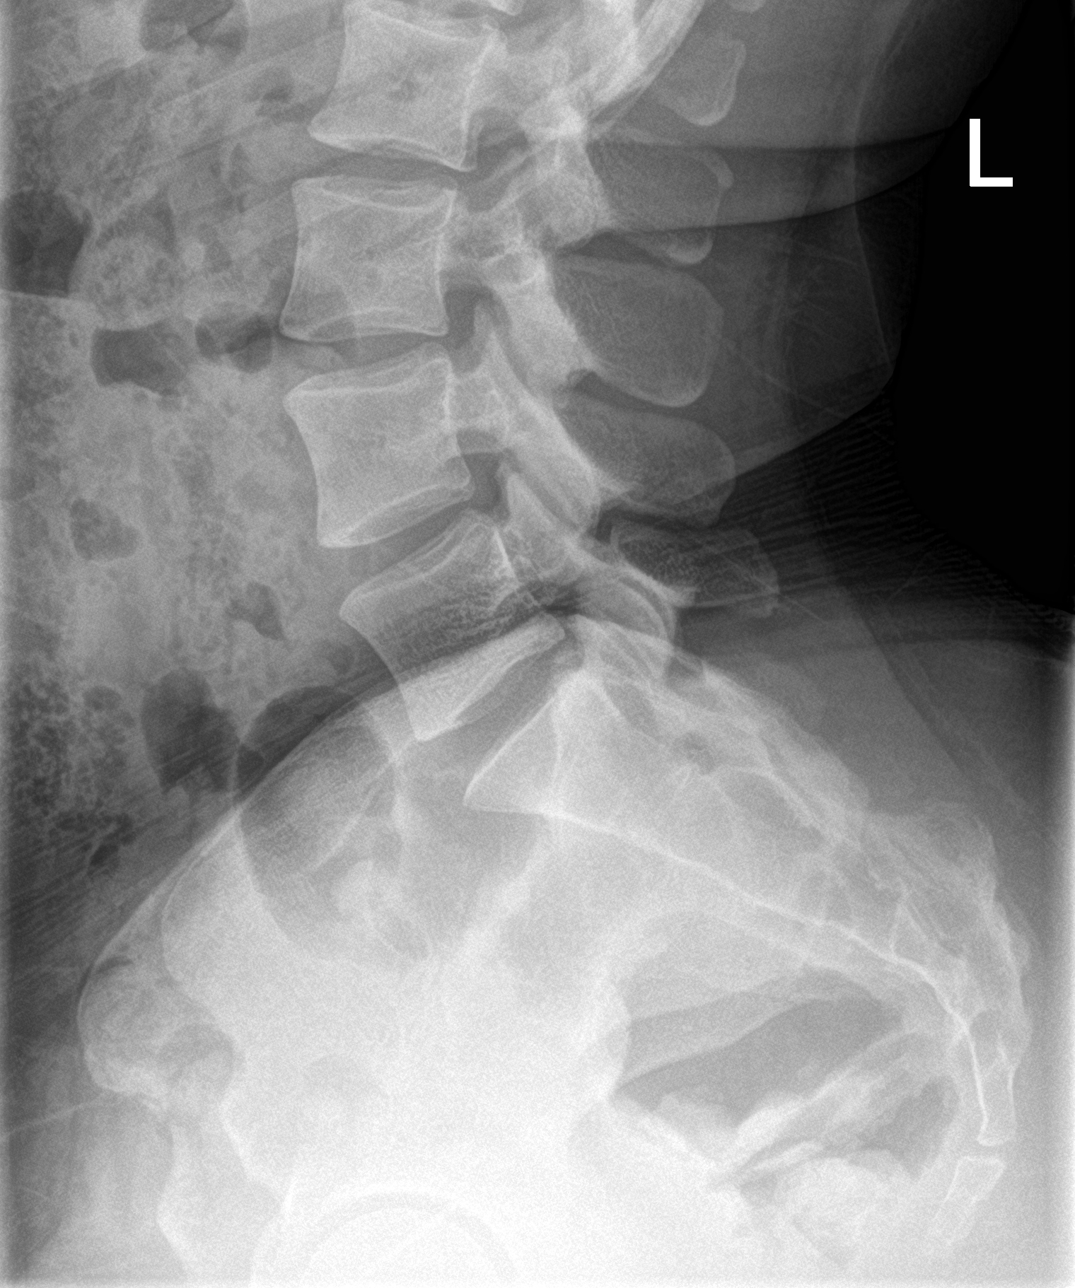
[im 5/5]
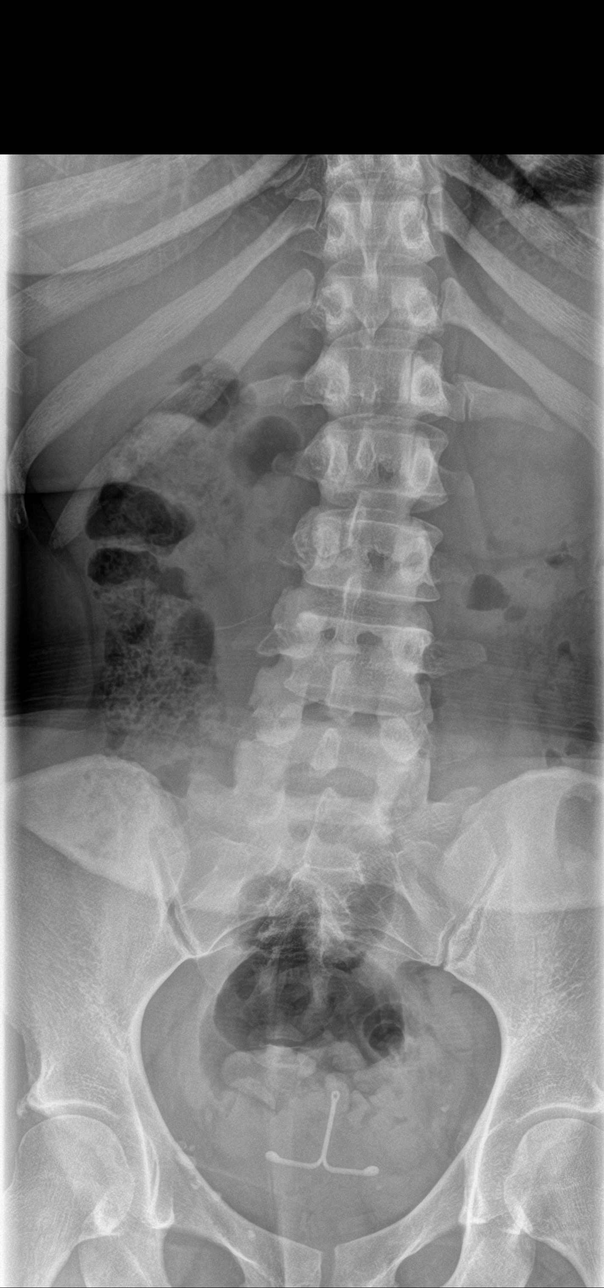

[5 of 5 positions shown; findings below may reference images not displayed]

FINDINGS: IUD noted the pelvis. Mild scoliosis concave right. No acute or
focal bony abnormality. No evidence of fracture. Pelvic
calcifications consistent with phleboliths.
IMPRESSION: Mild scoliosis concave right.  No acute or focal bony abnormality.

## 2023-06-09 IMAGING — CR DG SHOULDER 2+V*R*
1 series · 3 of 3 positions shown · non-contrast
Comparison: None.

CLINICAL DATA: Right shoulder pain

EXAM:
RIGHT SHOULDER - 2+ VIEW

[Series 1: dg shoulder right · 0.14mm/px · 3 of 3 slices shown]
[im 1/3]
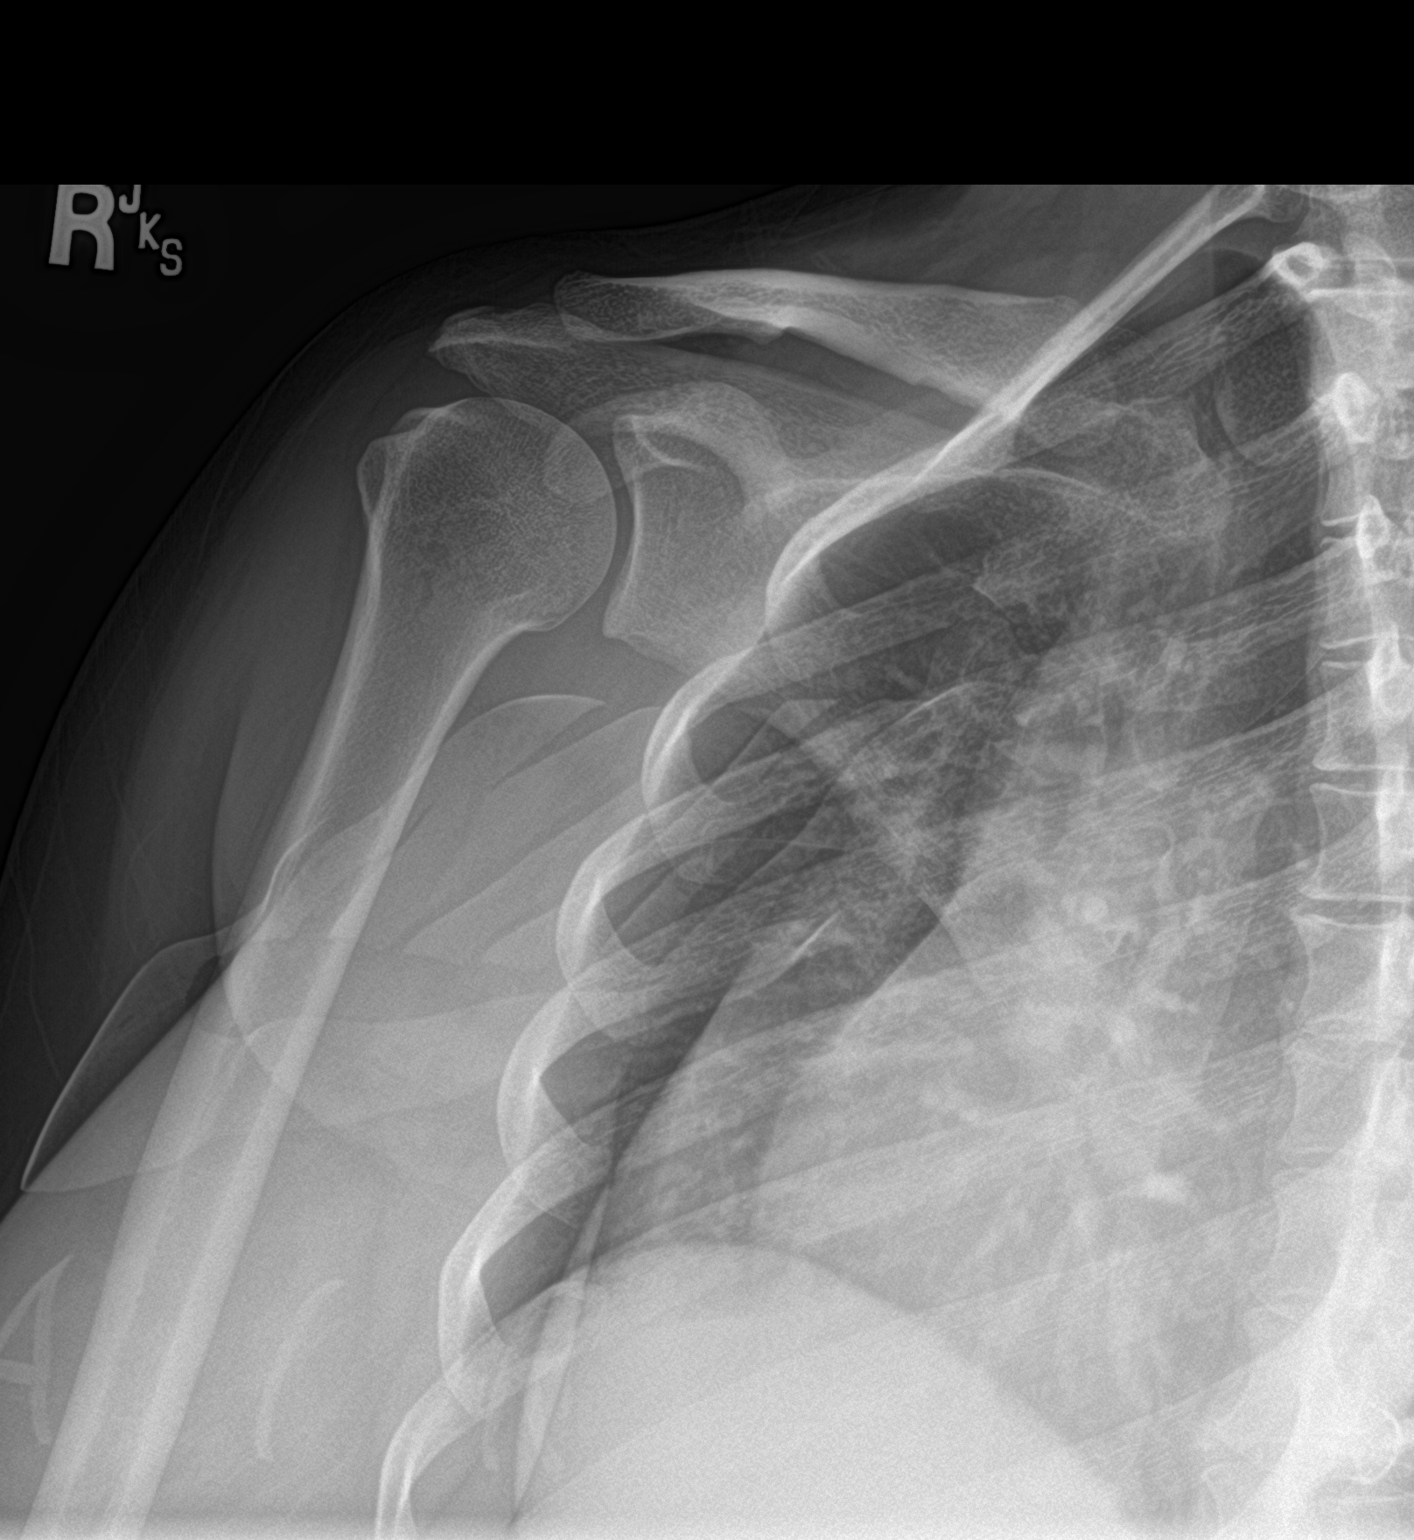
[im 2/3]
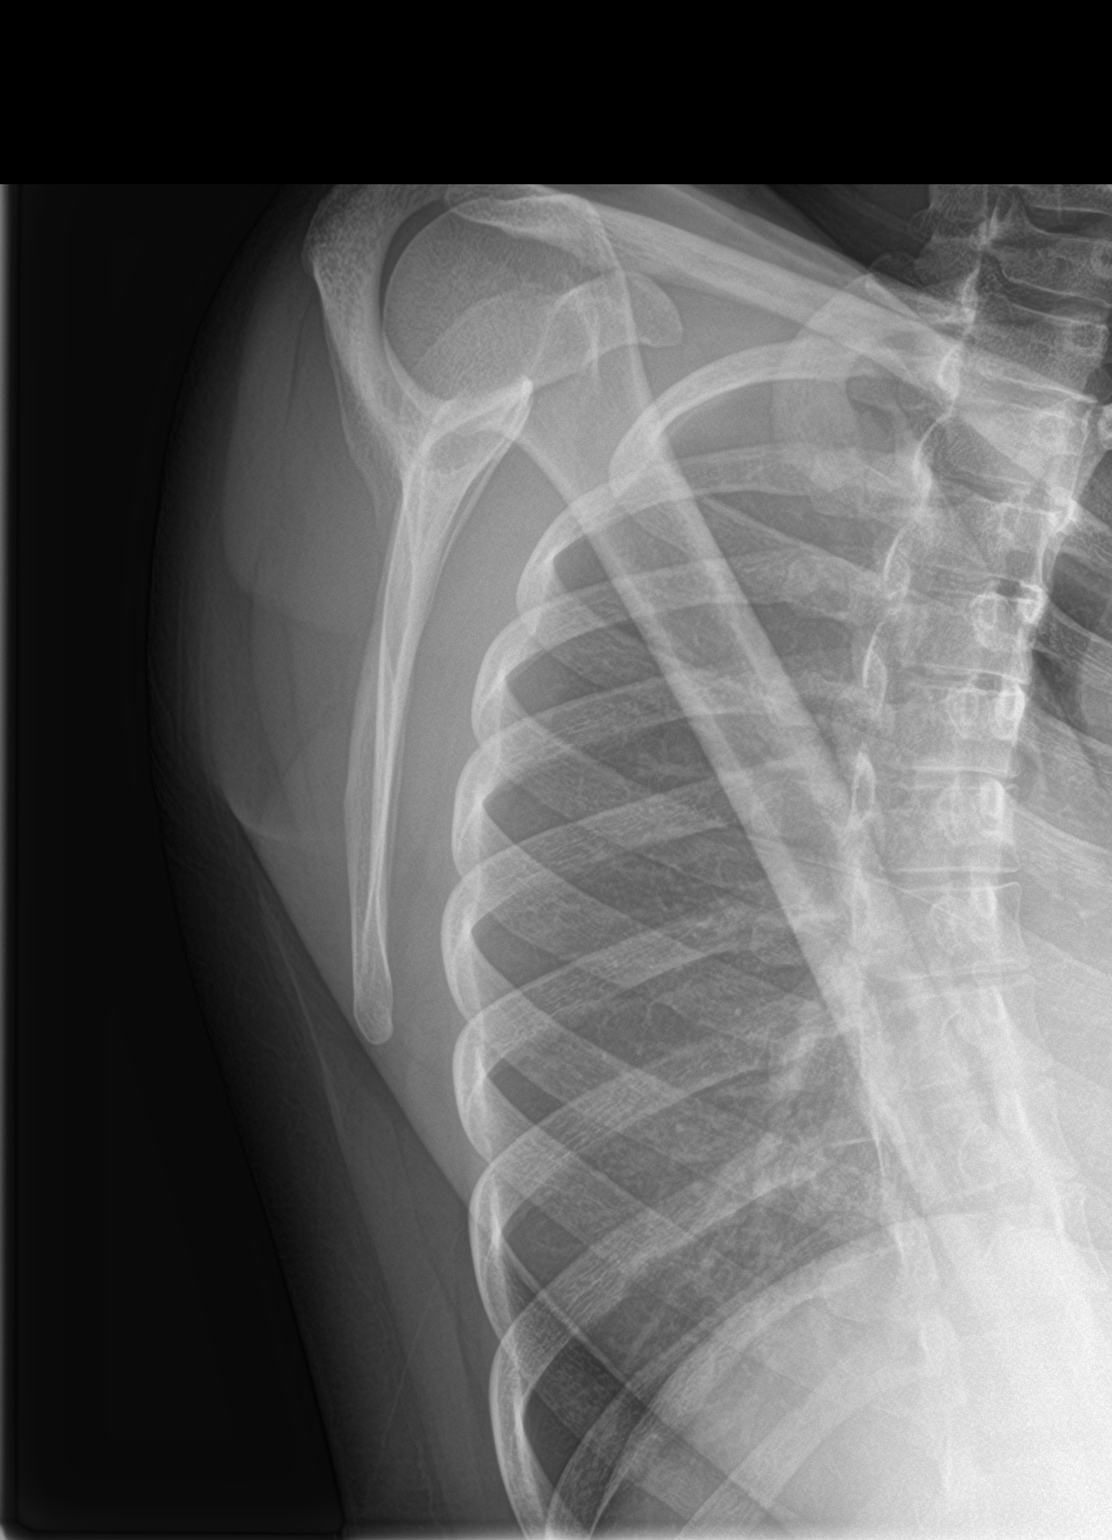
[im 3/3]
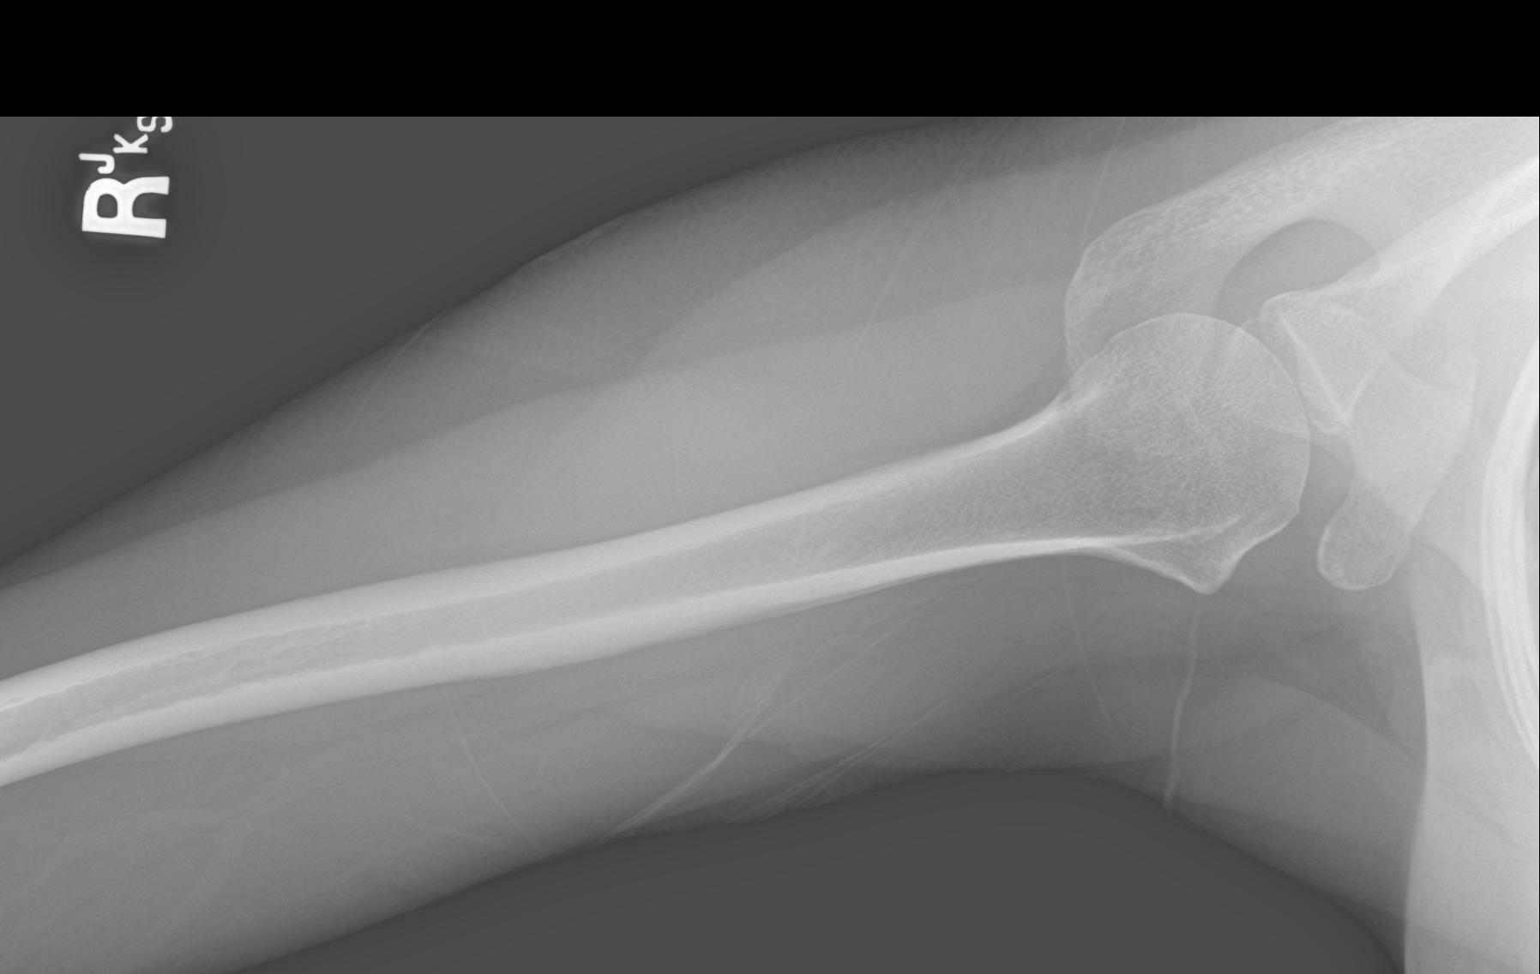

[3 of 3 positions shown; findings below may reference images not displayed]

FINDINGS: Frontal, transscapular, and axillary views of the right shoulder are
obtained. No fracture, subluxation, or dislocation. Joint spaces are
well preserved. Visualized portions of the right chest are clear.
IMPRESSION: 1. Unremarkable right shoulder.

## 2023-06-20 ENCOUNTER — Ambulatory Visit
Admission: RE | Admit: 2023-06-20 | Discharge: 2023-06-20 | Disposition: A | Discharge status: Home / Self Care | Payer: Commercial Managed Care - PPO | Source: Ambulatory Visit | Attending: Obstetrics and Gynecology | Admitting: Obstetrics and Gynecology

## 2023-06-20 ENCOUNTER — Other Ambulatory Visit: Payer: Self-pay | Admitting: Obstetrics and Gynecology

## 2023-06-20 DIAGNOSIS — R928 Other abnormal and inconclusive findings on diagnostic imaging of breast: Secondary | ICD-10-CM

## 2023-06-20 DIAGNOSIS — R921 Mammographic calcification found on diagnostic imaging of breast: Secondary | ICD-10-CM

## 2023-06-22 DIAGNOSIS — H5213 Myopia, bilateral: Secondary | ICD-10-CM | POA: Diagnosis not present

## 2023-06-24 ENCOUNTER — Encounter: Payer: Self-pay | Admitting: Family Medicine

## 2023-06-24 ENCOUNTER — Ambulatory Visit: Payer: Commercial Managed Care - PPO | Admitting: Family Medicine

## 2023-06-24 VITALS — BP 106/80 | Temp 98.4°F | Ht <= 58 in | Wt 124.8 lb

## 2023-06-24 DIAGNOSIS — L700 Acne vulgaris: Secondary | ICD-10-CM

## 2023-06-24 DIAGNOSIS — Z131 Encounter for screening for diabetes mellitus: Secondary | ICD-10-CM | POA: Diagnosis not present

## 2023-06-24 DIAGNOSIS — Z1159 Encounter for screening for other viral diseases: Secondary | ICD-10-CM

## 2023-06-24 DIAGNOSIS — Z0000 Encounter for general adult medical examination without abnormal findings: Secondary | ICD-10-CM | POA: Diagnosis not present

## 2023-06-24 DIAGNOSIS — E785 Hyperlipidemia, unspecified: Secondary | ICD-10-CM | POA: Diagnosis not present

## 2023-06-24 LAB — COMPREHENSIVE METABOLIC PANEL
ALT: 13 U/L (ref 0–35)
AST: 17 U/L (ref 0–37)
Albumin: 4.7 g/dL (ref 3.5–5.2)
Alkaline Phosphatase: 71 U/L (ref 39–117)
BUN: 16 mg/dL (ref 6–23)
CO2: 27 meq/L (ref 19–32)
Calcium: 9.9 mg/dL (ref 8.4–10.5)
Chloride: 105 meq/L (ref 96–112)
Creatinine, Ser: 0.9 mg/dL (ref 0.40–1.20)
GFR: 80.06 mL/min (ref >60.00)
Glucose, Bld: 94 mg/dL (ref 70–99)
Potassium: 4.8 meq/L (ref 3.5–5.1)
Sodium: 139 meq/L (ref 135–145)
Total Bilirubin: 0.6 mg/dL (ref 0.2–1.2)
Total Protein: 7.6 g/dL (ref 6.0–8.3)

## 2023-06-24 LAB — CBC WITH DIFFERENTIAL/PLATELET
Basophils Absolute: 0 10*3/uL (ref 0.0–0.1)
Basophils Relative: 0.5 % (ref 0.0–3.0)
Eosinophils Absolute: 0.1 10*3/uL (ref 0.0–0.7)
Eosinophils Relative: 0.8 % (ref 0.0–5.0)
HCT: 43.7 % (ref 36.0–46.0)
Hemoglobin: 15.2 g/dL — ABNORMAL HIGH (ref 12.0–15.0)
Lymphocytes Relative: 37.2 % (ref 12.0–46.0)
Lymphs Abs: 2.6 10*3/uL (ref 0.7–4.0)
MCHC: 34.7 g/dL (ref 30.0–36.0)
MCV: 87.9 fL (ref 78.0–100.0)
Monocytes Absolute: 0.6 10*3/uL (ref 0.1–1.0)
Monocytes Relative: 8.1 % (ref 3.0–12.0)
Neutro Abs: 3.7 10*3/uL (ref 1.4–7.7)
Neutrophils Relative %: 53.4 % (ref 43.0–77.0)
Platelets: 319 10*3/uL (ref 150.0–400.0)
RBC: 4.97 Mil/uL (ref 3.87–5.11)
RDW: 14.1 % (ref 11.5–15.5)
WBC: 7 10*3/uL (ref 4.0–10.5)

## 2023-06-24 LAB — HEMOGLOBIN A1C: Hgb A1c MFr Bld: 5.6 % (ref 4.6–6.5)

## 2023-06-24 LAB — LIPID PANEL
Cholesterol: 236 mg/dL — ABNORMAL HIGH (ref 0–200)
HDL: 64.9 mg/dL (ref >39.00)
LDL Cholesterol: 162 mg/dL — ABNORMAL HIGH (ref 0–99)
NonHDL: 171.2
Total CHOL/HDL Ratio: 4
Triglycerides: 46 mg/dL (ref 0.0–149.0)
VLDL: 9.2 mg/dL (ref 0.0–40.0)

## 2023-06-24 LAB — TSH: TSH: 1.36 u[IU]/mL (ref 0.35–5.50)

## 2023-06-24 MED ORDER — VALACYCLOVIR HCL 500 MG PO TABS: 500.0000 mg | ORAL_TABLET | Freq: Every day | ORAL | 0 refills | Status: DC | PRN

## 2023-06-24 MED ORDER — SPIRONOLACTONE 50 MG PO TABS: ORAL_TABLET | Freq: Every morning | ORAL | 3 refills | Status: AC

## 2023-06-24 NOTE — Progress Notes (Signed)
Established Patient Office Visit   Subjective  Patient ID: Annette Gutierrez, female    DOB: 17-Nov-1980  Age: 42 y.o. MRN: [id number]  Chief Complaint  Patient presents with   Annual Exam    Patient is a 42 year old female seen for CPE.  Patient states she is doing well overall.  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  Had initial mammogram at the breast center, 2 calcifications in right breast noted.  Diagnostic imaging and ultrasound done.  Patient advised to follow-up every 6 months for repeat mammograms.  Endorses Pap 05/30/2023 with ASCUS, negative HPV.  Yearly Paps advised by OB/GYN, Dr. Richardson Dopp.  Had influenza vaccine 05/03/2023    Patient Active Problem List   Diagnosis Date Noted   Acute midline low back pain without sciatica 11/05/2021   Acute pain of right shoulder 11/05/2021   PAC (premature atrial contraction) 04/24/2020   PVC's (premature ventricular contractions) 04/24/2020   Palpitations 04/24/2020   Hyperlipidemia 04/19/2019   Annual physical exam 02/08/2019   Acne vulgaris 11/21/2018   Past Medical History:  Diagnosis Date   Acne vulgaris 11/21/2018   Blood in stool    COVID-19    12/10/20   Hyperlipidemia 04/19/2019   Lipoma of back    Vestibular neuritis    2010   Past Surgical History:  Procedure Laterality Date   LIPOMA EXCISION Left 12/21/2018   Procedure: EXCISION LIPOMA OF BACK;  Surgeon: Peggye Form, DO;  Location: [facility];  Service: Plastics;  Laterality: Left;   Social History   Tobacco Use   Smoking status: Never   Smokeless tobacco: Never  Substance Use Topics   Alcohol use: Yes    Comment: occasionally   Drug use: No   Family History  Problem Relation Age of Onset   Diabetes Mother    Hyperlipidemia Mother    Hypertension Mother    Hyperlipidemia Father    Diabetes Maternal Uncle    Hyperlipidemia Maternal Grandmother    Cancer Maternal Grandfather        lung smoker   Cancer Paternal Grandfather        colon    Diabetes Maternal Uncle    No Known Allergies    ROS Negative unless stated above    Objective:     BP 106/80 (BP Location: Left Arm, Patient Position: Sitting, Cuff Size: Normal)   Temp 98.4 F (36.9 C) (Oral)   Ht 4\' 10"  (1.473 m)   Wt 124 lb 12.8 oz (56.6 kg)   LMP 06/18/2023 (Exact Date)   SpO2 97%   BMI 26.08 kg/m  BP Readings from Last 3 Encounters:  06/24/23 106/80  06/17/22 100/80  11/05/21 104/78   Wt Readings from Last 3 Encounters:  06/24/23 124 lb 12.8 oz (56.6 kg)  06/17/22 126 lb (57.2 kg)  11/05/21 127 lb (57.6 kg)      Physical Exam Constitutional:      Appearance: Normal appearance.  HENT:     Head: Normocephalic and atraumatic.     Right Ear: Tympanic membrane, ear canal and external ear normal.     Left Ear: Tympanic membrane, ear canal and external ear normal.     Nose: Nose normal.     Mouth/Throat:     Mouth: Mucous membranes are moist.     Pharynx: No oropharyngeal exudate or posterior oropharyngeal erythema.  Eyes:     General: No scleral icterus.    Extraocular Movements: Extraocular movements intact.     Conjunctiva/sclera:  Conjunctivae normal.     Pupils: Pupils are equal, round, and reactive to light.  Neck:     Thyroid: No thyromegaly.  Cardiovascular:     Rate and Rhythm: Normal rate and regular rhythm.     Pulses: Normal pulses.     Heart sounds: Normal heart sounds. No murmur heard.    No friction rub.  Pulmonary:     Effort: Pulmonary effort is normal.     Breath sounds: Normal breath sounds. No wheezing, rhonchi or rales.  Abdominal:     General: Bowel sounds are normal.     Palpations: Abdomen is soft.     Tenderness: There is no abdominal tenderness.  Musculoskeletal:        General: No deformity. Normal range of motion.  Lymphadenopathy:     Cervical: No cervical adenopathy.  Skin:    General: Skin is warm and dry.     Findings: No lesion.  Neurological:     General: No focal deficit present.     Mental  Status: She is alert and oriented to person, place, and time.  Psychiatric:        Mood and Affect: Mood normal.        Thought Content: Thought content normal.       06/24/2023    9:22 AM 06/17/2022    8:39 AM 11/05/2021    3:27 PM  Depression screen PHQ 2/9  Decreased Interest 0 0 0  Down, Depressed, Hopeless 0 0 0  PHQ - 2 Score 0 0 0  Altered sleeping 0 0   Tired, decreased energy 0 0   Change in appetite 0 0   Feeling bad or failure about yourself  0 0   Trouble concentrating 0 0   Moving slowly or fidgety/restless 0 0   Suicidal thoughts 0 0   PHQ-9 Score 0 0   Difficult doing work/chores  Not difficult at all       06/24/2023    9:22 AM 01/03/2020    1:04 PM 10/23/2019   10:05 AM  GAD 7 : Generalized Anxiety Score  Nervous, Anxious, on Edge 0 0 0  Control/stop worrying 0 0 0  Worry too much - different things 0 0 0  Trouble relaxing 0 0 0  Restless 0 0 0  Easily annoyed or irritable 0 0 0  Afraid - awful might happen 0 0 0  Total GAD 7 Score 0 0 0  Anxiety Difficulty   Not difficult at all      No results found for any visits on 06/24/23.    Assessment & Plan:  Well adult exam -Age-appropriate health screenings discussed -Obtain labs -Mammogram up-to-date done 06/20/2023 at the breast center at [facility].  74-month recall advised due to calcifications. -Immunizations reviewed and up-to-date.  Influenza vaccine given 05/03/2023 -Next CPE in 1 year -     CBC with Differential/Platelet; Future -     Comprehensive metabolic panel; Future -     Hemoglobin A1c; Future -     Lipid panel; Future -     TSH; Future  Hyperlipidemia, unspecified hyperlipidemia type -Lifestyle modifications -     Comprehensive metabolic panel; Future -     Lipid panel; Future  Encounter for hepatitis C screening test for low risk patient -     Hepatitis C antibody  Acne vulgaris -     Spironolactone; TAKE 1 TABLET BY MOUTH ONCE DAILY EVERY MORNING  Dispense: 90 tablet;  Refill: 3  Other orders -     valACYclovir HCl; Take 1 tablet (500 mg total) by mouth daily as needed. Take twice a day for 3-5 days outbreak  Dispense: 90 tablet; Refill: 0    Return in about 1 year (around 06/23/2024), or if symptoms worsen or fail to improve, for physical.   Deeann Saint, MD

## 2023-06-26 DIAGNOSIS — F4323 Adjustment disorder with mixed anxiety and depressed mood: Secondary | ICD-10-CM | POA: Diagnosis not present

## 2023-08-16 DIAGNOSIS — N898 Other specified noninflammatory disorders of vagina: Secondary | ICD-10-CM | POA: Diagnosis not present

## 2023-08-16 DIAGNOSIS — Z7251 High risk heterosexual behavior: Secondary | ICD-10-CM | POA: Diagnosis not present

## 2023-08-21 DIAGNOSIS — F4323 Adjustment disorder with mixed anxiety and depressed mood: Secondary | ICD-10-CM | POA: Diagnosis not present

## 2023-09-04 DIAGNOSIS — F4323 Adjustment disorder with mixed anxiety and depressed mood: Secondary | ICD-10-CM | POA: Diagnosis not present

## 2023-09-18 DIAGNOSIS — F4323 Adjustment disorder with mixed anxiety and depressed mood: Secondary | ICD-10-CM | POA: Diagnosis not present

## 2023-10-02 DIAGNOSIS — F4323 Adjustment disorder with mixed anxiety and depressed mood: Secondary | ICD-10-CM | POA: Diagnosis not present

## 2023-11-28 ENCOUNTER — Other Ambulatory Visit (HOSPITAL_COMMUNITY): Payer: Self-pay

## 2023-11-28 ENCOUNTER — Other Ambulatory Visit: Payer: Self-pay | Admitting: Family Medicine

## 2023-11-28 DIAGNOSIS — L700 Acne vulgaris: Secondary | ICD-10-CM

## 2023-11-29 ENCOUNTER — Other Ambulatory Visit (HOSPITAL_COMMUNITY): Payer: Self-pay

## 2023-11-29 MED ORDER — SPIRONOLACTONE 50 MG PO TABS
ORAL_TABLET | Freq: Every morning | ORAL | 3 refills | Status: AC
  Filled 2023-11-29: qty 90, 90d supply, fill #0

## 2023-12-07 ENCOUNTER — Other Ambulatory Visit: Payer: Self-pay | Admitting: Family Medicine

## 2023-12-07 ENCOUNTER — Other Ambulatory Visit (HOSPITAL_COMMUNITY): Payer: Self-pay

## 2023-12-07 DIAGNOSIS — L700 Acne vulgaris: Secondary | ICD-10-CM

## 2023-12-07 MED ORDER — CLINDAMYCIN PHOSPHATE 1 % EX SOLN
CUTANEOUS | 11 refills | Status: DC
  Filled 2023-12-07: qty 30, 30d supply, fill #0
  Filled 2024-05-10: qty 30, 30d supply, fill #1

## 2023-12-19 ENCOUNTER — Ambulatory Visit: Payer: Self-pay | Admitting: Family Medicine

## 2023-12-19 DIAGNOSIS — E785 Hyperlipidemia, unspecified: Secondary | ICD-10-CM

## 2023-12-20 ENCOUNTER — Ambulatory Visit
Admission: RE | Admit: 2023-12-20 | Discharge: 2023-12-20 | Disposition: A | Discharge status: Home / Self Care | Payer: Commercial Managed Care - PPO | Source: Ambulatory Visit | Attending: Obstetrics and Gynecology | Admitting: Obstetrics and Gynecology

## 2023-12-20 DIAGNOSIS — R921 Mammographic calcification found on diagnostic imaging of breast: Secondary | ICD-10-CM

## 2023-12-21 ENCOUNTER — Other Ambulatory Visit: Payer: Self-pay | Admitting: Obstetrics and Gynecology

## 2023-12-21 DIAGNOSIS — R921 Mammographic calcification found on diagnostic imaging of breast: Secondary | ICD-10-CM

## 2024-05-10 ENCOUNTER — Other Ambulatory Visit (HOSPITAL_COMMUNITY): Payer: Self-pay

## 2024-05-10 MED ORDER — FLUZONE 0.5 ML IM SUSY
PREFILLED_SYRINGE | INTRAMUSCULAR | 0 refills | Status: AC
Start: 2024-05-10 — End: ?
  Filled 2024-05-10: qty 0.5, 1d supply, fill #0

## 2024-06-12 DIAGNOSIS — Z01419 Encounter for gynecological examination (general) (routine) without abnormal findings: Secondary | ICD-10-CM | POA: Diagnosis not present

## 2024-06-22 ENCOUNTER — Ambulatory Visit
Admission: RE | Admit: 2024-06-22 | Discharge: 2024-06-22 | Disposition: A | Discharge status: Home / Self Care | Source: Ambulatory Visit | Attending: Obstetrics and Gynecology | Admitting: Obstetrics and Gynecology

## 2024-06-22 ENCOUNTER — Other Ambulatory Visit: Payer: Self-pay | Admitting: Family Medicine

## 2024-06-22 DIAGNOSIS — R928 Other abnormal and inconclusive findings on diagnostic imaging of breast: Secondary | ICD-10-CM

## 2024-06-22 DIAGNOSIS — R921 Mammographic calcification found on diagnostic imaging of breast: Secondary | ICD-10-CM

## 2024-06-27 ENCOUNTER — Other Ambulatory Visit (HOSPITAL_COMMUNITY): Payer: Self-pay

## 2024-06-27 ENCOUNTER — Ambulatory Visit: Admitting: Family Medicine

## 2024-06-27 ENCOUNTER — Other Ambulatory Visit: Payer: Self-pay

## 2024-06-27 ENCOUNTER — Encounter: Payer: Self-pay | Admitting: Family Medicine

## 2024-06-27 VITALS — BP 118/72 | HR 85 | Temp 98.8°F | Ht 60.24 in | Wt 129.2 lb

## 2024-06-27 DIAGNOSIS — Z113 Encounter for screening for infections with a predominantly sexual mode of transmission: Secondary | ICD-10-CM | POA: Diagnosis not present

## 2024-06-27 DIAGNOSIS — N951 Menopausal and female climacteric states: Secondary | ICD-10-CM

## 2024-06-27 DIAGNOSIS — L700 Acne vulgaris: Secondary | ICD-10-CM

## 2024-06-27 DIAGNOSIS — E785 Hyperlipidemia, unspecified: Secondary | ICD-10-CM

## 2024-06-27 DIAGNOSIS — Z0000 Encounter for general adult medical examination without abnormal findings: Secondary | ICD-10-CM | POA: Diagnosis not present

## 2024-06-27 LAB — LIPID PANEL
Cholesterol: 183 mg/dL (ref 0–200)
HDL: 58.2 mg/dL (ref >39.00)
LDL Cholesterol: 115 mg/dL — ABNORMAL HIGH (ref 0–99)
NonHDL: 124.53
Total CHOL/HDL Ratio: 3
Triglycerides: 48 mg/dL (ref 0.0–149.0)
VLDL: 9.6 mg/dL (ref 0.0–40.0)

## 2024-06-27 LAB — CBC WITH DIFFERENTIAL/PLATELET
Basophils Absolute: 0 K/uL (ref 0.0–0.1)
Basophils Relative: 0.7 % (ref 0.0–3.0)
Eosinophils Absolute: 0.1 K/uL (ref 0.0–0.7)
Eosinophils Relative: 0.9 % (ref 0.0–5.0)
HCT: 40.9 % (ref 36.0–46.0)
Hemoglobin: 13.9 g/dL (ref 12.0–15.0)
Lymphocytes Relative: 34.4 % (ref 12.0–46.0)
Lymphs Abs: 2.1 K/uL (ref 0.7–4.0)
MCHC: 33.9 g/dL (ref 30.0–36.0)
MCV: 84.6 fl (ref 78.0–100.0)
Monocytes Absolute: 0.5 K/uL (ref 0.1–1.0)
Monocytes Relative: 8 % (ref 3.0–12.0)
Neutro Abs: 3.4 K/uL (ref 1.4–7.7)
Neutrophils Relative %: 56 % (ref 43.0–77.0)
Platelets: 277 K/uL (ref 150.0–400.0)
RBC: 4.84 Mil/uL (ref 3.87–5.11)
RDW: 13.6 % (ref 11.5–15.5)
WBC: 6.1 K/uL (ref 4.0–10.5)

## 2024-06-27 LAB — COMPREHENSIVE METABOLIC PANEL WITH GFR
ALT: 9 U/L (ref 0–35)
AST: 14 U/L (ref 0–37)
Albumin: 4.2 g/dL (ref 3.5–5.2)
Alkaline Phosphatase: 57 U/L (ref 39–117)
BUN: 8 mg/dL (ref 6–23)
CO2: 26 meq/L (ref 19–32)
Calcium: 9.6 mg/dL (ref 8.4–10.5)
Chloride: 107 meq/L (ref 96–112)
Creatinine, Ser: 0.84 mg/dL (ref 0.40–1.20)
GFR: 86.35 mL/min (ref >60.00)
Glucose, Bld: 94 mg/dL (ref 70–99)
Potassium: 4.7 meq/L (ref 3.5–5.1)
Sodium: 138 meq/L (ref 135–145)
Total Bilirubin: 0.4 mg/dL (ref 0.2–1.2)
Total Protein: 6.7 g/dL (ref 6.0–8.3)

## 2024-06-27 LAB — TSH: TSH: 2.82 u[IU]/mL (ref 0.35–5.50)

## 2024-06-27 LAB — T4, FREE: Free T4: 0.72 ng/dL (ref 0.60–1.60)

## 2024-06-27 LAB — HEMOGLOBIN A1C: Hgb A1c MFr Bld: 5.6 % (ref 4.6–6.5)

## 2024-06-27 MED ORDER — TRETINOIN 0.1 % EX CREA
TOPICAL_CREAM | Freq: Every day | CUTANEOUS | 11 refills | Status: AC
  Filled 2024-06-27: qty 45, 30d supply, fill #0

## 2024-06-27 MED ORDER — CLINDAMYCIN PHOSPHATE 1 % EX SOLN
CUTANEOUS | 11 refills | Status: AC
  Filled 2024-06-27: qty 60, 30d supply, fill #0

## 2024-06-27 NOTE — Progress Notes (Signed)
 Established Patient Office Visit   Subjective  Patient ID: Annette Gutierrez, female    DOB: 07-17-1981  Age: 43 y.o. MRN: [id number]  Chief Complaint  Patient presents with   Annual Exam    Pt is a  yo female seen for CPE.  Pt is fasting.  Doing well.  Having some perimenopausal symptoms including hot flashes mood swings.  Sleep is okay is taking magnesium  supplement at night.  Considering taking a multivitamin for perimenopausal symptoms.  Would like to avoid hormones.  Mirena  in place. Requesting refills on clindamycin  lotion and tretinoin  for acne.    Patient Active Problem List   Diagnosis Date Noted   Acute midline low back pain without sciatica 11/05/2021   Acute pain of right shoulder 11/05/2021   PAC (premature atrial contraction) 04/24/2020   PVC's (premature ventricular contractions) 04/24/2020   Palpitations 04/24/2020   Hyperlipidemia 04/19/2019   Annual physical exam 02/08/2019   Acne vulgaris 11/21/2018   Past Medical History:  Diagnosis Date   Acne vulgaris 11/21/2018   Anxiety    Blood in stool    COVID-19    12/10/20   Hyperlipidemia 04/19/2019   Lipoma of back    Vestibular neuritis    2010   Past Surgical History:  Procedure Laterality Date   COSMETIC SURGERY  12/21/2018   Excision of lipoma   LIPOMA EXCISION Left 12/21/2018   Procedure: EXCISION LIPOMA OF BACK;  Surgeon: Lowery Estefana RAMAN, DO;  Location: [facility];  Service: Plastics;  Laterality: Left;   Social History   Tobacco Use   Smoking status: Never   Smokeless tobacco: Never  Substance Use Topics   Alcohol use: Yes    Alcohol/week: 1.0 standard drink of alcohol    Types: 1 Glasses of wine per week    Comment: occasionally   Drug use: Never   Family History  Problem Relation Age of Onset   Diabetes Mother    Hyperlipidemia Mother    Hypertension Mother    Hyperlipidemia Father    Diabetes Maternal Uncle    Hyperlipidemia Maternal Grandmother    Cancer  Maternal Grandfather        lung smoker   Cancer Paternal Grandfather        colon   Diabetes Maternal Uncle    No Known Allergies  ROS Negative unless stated above    Objective:     BP 118/72 (BP Location: Right Arm, Patient Position: Sitting, Cuff Size: Normal)   Pulse 85   Temp 98.8 F (37.1 C) (Oral)   Ht 5' 0.24 (1.53 m)   Wt 129 lb 3.2 oz (58.6 kg)   SpO2 99%   BMI 25.04 kg/m  BP Readings from Last 3 Encounters:  06/27/24 118/72  06/24/23 106/80  06/17/22 100/80   Wt Readings from Last 3 Encounters:  06/27/24 129 lb 3.2 oz (58.6 kg)  06/24/23 124 lb 12.8 oz (56.6 kg)  06/17/22 126 lb (57.2 kg)      Physical Exam Constitutional:      Appearance: Normal appearance.  HENT:     Head: Normocephalic and atraumatic.     Right Ear: Tympanic membrane, ear canal and external ear normal.     Left Ear: Tympanic membrane, ear canal and external ear normal.     Nose: Nose normal.     Mouth/Throat:     Mouth: Mucous membranes are moist.     Pharynx: No oropharyngeal exudate or posterior oropharyngeal erythema.  Eyes:  General: No scleral icterus.    Extraocular Movements: Extraocular movements intact.     Conjunctiva/sclera: Conjunctivae normal.     Pupils: Pupils are equal, round, and reactive to light.  Neck:     Thyroid : No thyromegaly.     Vascular: No carotid bruit.  Cardiovascular:     Rate and Rhythm: Normal rate and regular rhythm.     Pulses: Normal pulses.     Heart sounds: Normal heart sounds. No murmur heard.    No friction rub.  Pulmonary:     Effort: Pulmonary effort is normal.     Breath sounds: Normal breath sounds. No wheezing, rhonchi or rales.  Abdominal:     General: Bowel sounds are normal.     Palpations: Abdomen is soft.     Tenderness: There is no abdominal tenderness.  Musculoskeletal:        General: No deformity. Normal range of motion.  Lymphadenopathy:     Cervical: No cervical adenopathy.  Skin:    General: Skin is warm  and dry.     Findings: No lesion.  Neurological:     General: No focal deficit present.     Mental Status: She is alert and oriented to person, place, and time.  Psychiatric:        Mood and Affect: Mood normal.        Thought Content: Thought content normal.        06/24/2023    9:22 AM 06/17/2022    8:39 AM 11/05/2021    3:27 PM  Depression screen PHQ 2/9  Decreased Interest 0 0 0  Down, Depressed, Hopeless 0 0 0  PHQ - 2 Score 0 0 0  Altered sleeping 0 0   Tired, decreased energy 0 0   Change in appetite 0 0   Feeling bad or failure about yourself  0 0   Trouble concentrating 0 0   Moving slowly or fidgety/restless 0 0   Suicidal thoughts 0 0   PHQ-9 Score 0  0    Difficult doing work/chores  Not difficult at all      Data saved with a previous flowsheet row definition      06/24/2023    9:22 AM 01/03/2020    1:04 PM 10/23/2019   10:05 AM  GAD 7 : Generalized Anxiety Score  Nervous, Anxious, on Edge 0 0 0  Control/stop worrying 0 0 0  Worry too much - different things 0 0 0  Trouble relaxing 0 0 0  Restless 0 0 0  Easily annoyed or irritable 0 0 0  Afraid - awful might happen 0 0 0  Total GAD 7 Score 0 0 0  Anxiety Difficulty   Not difficult at all     No results found for any visits on 06/27/24.    Assessment & Plan:   Well adult exam -     CBC with Differential/Platelet; Future -     Comprehensive metabolic panel with GFR; Future -     Hemoglobin A1c; Future -     Lipid panel; Future -     T4, free; Future -     TSH; Future  Hyperlipidemia, unspecified hyperlipidemia type -     Lipid panel; Future  Acne vulgaris -     Tretinoin ; Apply topically at bedtime. With moisturizer  Dispense: 45 g; Refill: 11 -     Clindamycin  Phosphate; Apply to affected area twice a day.  Dispense: 30 mL; Refill: 11  Screening  examination for STI -     C. trachomatis/N. gonorrhoeae RNA; Future  Perimenopause  Age- appropriate health screenings discussed.  Obtain  labs.  Immunizations reviewed and up-to-date.  Colonoscopy done 11/17/2015.  Repeat at age 27.  Mammogram done 06/22/2024.  Pap done 05/30/2023 with OB/GYN.  Continue lifestyle modifications for cholesterol.  Recheck this visit.  Discussed symptom and treatment options for perimenopause.  Will continue to monitor.  Acne much improved.  Continue current medications.  Refills provided.  Return in about 1 year (around 06/27/2025) for physical.  Sooner if needed.  Clotilda JONELLE Single, MD

## 2024-06-28 LAB — C. TRACHOMATIS/N. GONORRHOEAE RNA
C. trachomatis RNA, TMA: NOT DETECTED
N. gonorrhoeae RNA, TMA: NOT DETECTED

## 2024-07-04 ENCOUNTER — Ambulatory Visit: Payer: Self-pay | Admitting: Family Medicine

## 2024-07-10 ENCOUNTER — Other Ambulatory Visit: Payer: Self-pay | Admitting: Family Medicine

## 2024-07-10 DIAGNOSIS — B009 Herpesviral infection, unspecified: Secondary | ICD-10-CM

## 2024-07-10 MED ORDER — VALACYCLOVIR HCL 500 MG PO TABS: 500.0000 mg | ORAL_TABLET | Freq: Every day | ORAL | 1 refills | Status: AC | PRN
